# Patient Record
Sex: Female | Born: 1985 | Hispanic: Yes | Marital: Single | Smoking: Never smoker
Health system: Southern US, Community
[De-identification: ages and names within clinical notes are randomized; demographics above are authoritative.]

## PROBLEM LIST (undated history)

## (undated) DIAGNOSIS — J45909 Unspecified asthma, uncomplicated: Secondary | ICD-10-CM

## (undated) DIAGNOSIS — F419 Anxiety disorder, unspecified: Secondary | ICD-10-CM

## (undated) DIAGNOSIS — R87619 Unspecified abnormal cytological findings in specimens from cervix uteri: Secondary | ICD-10-CM

## (undated) DIAGNOSIS — B373 Candidiasis of vulva and vagina: Secondary | ICD-10-CM

## (undated) DIAGNOSIS — E559 Vitamin D deficiency, unspecified: Secondary | ICD-10-CM

## (undated) DIAGNOSIS — R002 Palpitations: Secondary | ICD-10-CM

## (undated) DIAGNOSIS — R0789 Other chest pain: Secondary | ICD-10-CM

## (undated) DIAGNOSIS — J309 Allergic rhinitis, unspecified: Secondary | ICD-10-CM

## (undated) DIAGNOSIS — Z6825 Body mass index (BMI) 25.0-25.9, adult: Secondary | ICD-10-CM

## (undated) DIAGNOSIS — K589 Irritable bowel syndrome without diarrhea: Secondary | ICD-10-CM

## (undated) DIAGNOSIS — Z30018 Encounter for initial prescription of other contraceptives: Secondary | ICD-10-CM

## (undated) DIAGNOSIS — B3731 Acute candidiasis of vulva and vagina: Secondary | ICD-10-CM

## (undated) DIAGNOSIS — G5600 Carpal tunnel syndrome, unspecified upper limb: Secondary | ICD-10-CM

## (undated) DIAGNOSIS — Z6824 Body mass index (BMI) 24.0-24.9, adult: Secondary | ICD-10-CM

## (undated) DIAGNOSIS — B009 Herpesviral infection, unspecified: Secondary | ICD-10-CM

## (undated) DIAGNOSIS — T7840XA Allergy, unspecified, initial encounter: Secondary | ICD-10-CM

## (undated) DIAGNOSIS — G43909 Migraine, unspecified, not intractable, without status migrainosus: Secondary | ICD-10-CM

## (undated) HISTORY — DX: Irritable bowel syndrome, unspecified: K58.9

## (undated) HISTORY — DX: Vitamin D deficiency, unspecified: E55.9

## (undated) HISTORY — DX: Body mass index (BMI) 24.0-24.9, adult: Z68.24

## (undated) HISTORY — DX: Unspecified asthma, uncomplicated: J45.909

## (undated) HISTORY — DX: Carpal tunnel syndrome, unspecified upper limb: G56.00

## (undated) HISTORY — DX: Body mass index (BMI) 25.0-25.9, adult: Z68.25

## (undated) HISTORY — DX: Allergic rhinitis, unspecified: J30.9

## (undated) HISTORY — DX: Allergy, unspecified, initial encounter: T78.40XA

## (undated) HISTORY — DX: Palpitations: R00.2

## (undated) HISTORY — DX: Acute candidiasis of vulva and vagina: B37.31

## (undated) HISTORY — DX: Encounter for initial prescription of other contraceptives: Z30.018

## (undated) HISTORY — DX: Migraine, unspecified, not intractable, without status migrainosus: G43.909

## (undated) HISTORY — PX: INTRAUTERINE DEVICE INSERTION: SHX323

## (undated) HISTORY — DX: Unspecified abnormal cytological findings in specimens from cervix uteri: R87.619

## (undated) HISTORY — DX: Other chest pain: R07.89

## (undated) HISTORY — DX: Anxiety disorder, unspecified: F41.9

## (undated) HISTORY — DX: Candidiasis of vulva and vagina: B37.3

## (undated) HISTORY — DX: Herpesviral infection, unspecified: B00.9

---
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
## 2012-01-15 DIAGNOSIS — B009 Herpesviral infection, unspecified: Secondary | ICD-10-CM

## 2012-01-15 HISTORY — DX: Herpesviral infection, unspecified: B00.9

## 2012-07-14 HISTORY — PX: PLACEMENT OF BREAST IMPLANTS: SHX6334

## 2013-09-21 ENCOUNTER — Encounter: Payer: Self-pay | Admitting: Obstetrics and Gynecology

## 2013-10-06 ENCOUNTER — Encounter: Payer: Self-pay | Admitting: Gynecology

## 2013-10-18 ENCOUNTER — Encounter: Payer: Self-pay | Admitting: Gynecology

## 2013-12-22 ENCOUNTER — Encounter: Payer: Self-pay | Admitting: Gynecology

## 2014-01-04 ENCOUNTER — Encounter: Payer: Self-pay | Admitting: Nurse Practitioner

## 2014-01-04 ENCOUNTER — Ambulatory Visit (INDEPENDENT_AMBULATORY_CARE_PROVIDER_SITE_OTHER): Payer: BC Managed Care – PPO | Admitting: Nurse Practitioner

## 2014-01-04 VITALS — BP 102/64 | HR 72 | Ht 64.25 in | Wt 132.0 lb

## 2014-01-04 DIAGNOSIS — Z0000 Encounter for general adult medical examination without abnormal findings: Secondary | ICD-10-CM

## 2014-01-04 DIAGNOSIS — A499 Bacterial infection, unspecified: Secondary | ICD-10-CM

## 2014-01-04 DIAGNOSIS — B9689 Other specified bacterial agents as the cause of diseases classified elsewhere: Secondary | ICD-10-CM

## 2014-01-04 DIAGNOSIS — Z3049 Encounter for surveillance of other contraceptives: Secondary | ICD-10-CM

## 2014-01-04 DIAGNOSIS — Z01419 Encounter for gynecological examination (general) (routine) without abnormal findings: Secondary | ICD-10-CM

## 2014-01-04 DIAGNOSIS — N760 Acute vaginitis: Secondary | ICD-10-CM

## 2014-01-04 MED ORDER — VALACYCLOVIR HCL 1 G PO TABS: 1000.0000 mg | ORAL_TABLET | Freq: Every day | ORAL | Status: DC

## 2014-01-04 NOTE — Patient Instructions (Addendum)
General topics  Next pap or exam is  due in 1 year Take a Women's multivitamin Take 1200 mg. of calcium daily - prefer dietary If any concerns in interim to call back  Breast Self-Awareness Practicing breast self-awareness may pick up problems early, prevent significant medical complications, and possibly save your life. By practicing breast self-awareness, you can become familiar with how your breasts look and feel and if your breasts are changing. This allows you to notice changes early. It can also offer you some reassurance that your breast health is good. One way to learn what is normal for your breasts and whether your breasts are changing is to do a breast self-exam. If you find a lump or something that was not present in the past, it is best to contact your caregiver right away. Other findings that should be evaluated by your caregiver include nipple discharge, especially if it is bloody; skin changes or reddening; areas where the skin seems to be pulled in (retracted); or new lumps and bumps. Breast pain is seldom associated with cancer (malignancy), but should also be evaluated by a caregiver. BREAST SELF-EXAM The best time to examine your breasts is 5 7 days after your menstrual period is over.  ExitCare Patient Information 2013 [employer]Care[employer].   Exercise to Stay Healthy Exercise helps you become and stay healthy. EXERCISE IDEAS AND TIPS Choose exercises that:  You enjoy.  Fit into your day. You do not need to exercise really hard to be healthy. You can do exercises at a slow or medium level and stay healthy. You can:  Stretch before and after working out.  Try yoga, Pilates, or tai chi.  Lift weights.  Walk fast, swim, jog, run, climb stairs, bicycle, dance, or rollerskate.  Take aerobic classes. Exercises that burn about 150 calories:  Running 1  miles in 15 minutes.  Playing volleyball for 45 to 60 minutes.  Washing and waxing a car for 45 to 60  minutes.  Playing touch football for 45 minutes.  Walking 1  miles in 35 minutes.  Pushing a stroller 1  miles in 30 minutes.  Playing basketball for 30 minutes.  Raking leaves for 30 minutes.  Bicycling 5 miles in 30 minutes.  Walking 2 miles in 30 minutes.  Dancing for 30 minutes.  Shoveling snow for 15 minutes.  Swimming laps for 20 minutes.  Walking up stairs for 15 minutes.  Bicycling 4 miles in 15 minutes.  Gardening for 30 to 45 minutes.  Jumping rope for 15 minutes.  Washing windows or floors for 45 to 60 minutes. Document Released: 02/02/2010 Document Revised: 03/25/2011 Document Reviewed: 02/02/2010 ExitCare Patient Information 2013 ExitCare[employer].   Other topics ( that may be useful information):    Sexually Transmitted Disease Sexually transmitted disease (STD) refers to any infection that is passed from person to person during sexual activity. This may happen by way of saliva, semen, blood, vaginal mucus, or urine. Common STDs include:  Gonorrhea.  Chlamydia.  Syphilis.  HIV/AIDS.  Genital herpes.  Hepatitis B and C.  Trichomonas.  Human papillomavirus (HPV).  Pubic lice. CAUSES  An STD may be spread by bacteria, virus, or parasite. A person can get an STD by:  Sexual intercourse with an infected person.  Sharing sex toys with an infected person.  Sharing needles with an infected person.  Having intimate contact with the genitals, mouth, or rectal areas of an infected person. SYMPTOMS  Some people may not have any symptoms, but   they can still pass the infection to others. Different STDs have different symptoms. Symptoms include:  Painful or bloody urination.  Pain in the pelvis, abdomen, vagina, anus, throat, or eyes.  Skin rash, itching, irritation, growths, or sores (lesions). These usually occur in the genital or anal area.  Abnormal vaginal discharge.  Penile discharge in men.  Soft, flesh-colored skin growths in the  genital or anal area.  Fever.  Pain or bleeding during sexual intercourse.  Swollen glands in the groin area.  Yellow skin and eyes (jaundice). This is seen with hepatitis. DIAGNOSIS  To make a diagnosis, your caregiver may:  Take a medical history.  Perform a physical exam.  Take a specimen (culture) to be examined.  Examine a sample of discharge under a microscope.  Perform blood test TREATMENT   Chlamydia, gonorrhea, trichomonas, and syphilis can be cured with antibiotic medicine.  Genital herpes, hepatitis, and HIV can be treated, but not cured, with prescribed medicines. The medicines will lessen the symptoms.  Genital warts from HPV can be treated with medicine or by freezing, burning (electrocautery), or surgery. Warts may come back.  HPV is a virus and cannot be cured with medicine or surgery.However, abnormal areas may be followed very closely by your caregiver and may be removed from the cervix, vagina, or vulva through office procedures or surgery. If your diagnosis is confirmed, your recent sexual partners need treatment. This is true even if they are symptom-free or have a negative culture or evaluation. They should not have sex until their caregiver says it is okay. HOME CARE INSTRUCTIONS  All sexual partners should be informed, tested, and treated for all STDs.  Take your antibiotics as directed. Finish them even if you start to feel better.  Only take over-the-counter or prescription medicines for pain, discomfort, or fever as directed by your caregiver.  Rest.  Eat a balanced diet and drink enough fluids to keep your urine clear or pale yellow.  Do not have sex until treatment is completed and you have followed up with your caregiver. STDs should be checked after treatment.  Keep all follow-up appointments, Pap tests, and blood tests as directed by your caregiver.  Only use latex condoms and water-soluble lubricants during sexual activity. Do not use  petroleum jelly or oils.  Avoid alcohol and illegal drugs.  Get vaccinated for HPV and hepatitis. If you have not received these vaccines in the past, talk to your caregiver about whether one or both might be right for you.  Avoid risky sex practices that can break the skin. The only way to avoid getting an STD is to avoid all sexual activity.Latex condoms and dental dams (for oral sex) will help lessen the risk of getting an STD, but will not completely eliminate the risk. SEEK MEDICAL CARE IF:   You have a fever.  You have any new or worsening symptoms. Document Released: 03/23/2002 Document Revised: 03/25/2011 Document Reviewed: 03/30/2010 [facility] Patient Information 2013 [place].    Domestic Abuse You are being battered or abused if someone close to you hits, pushes, or physically hurts you in any way. You also are being abused if you are forced into activities. You are being sexually abused if you are forced to have sexual contact of any kind. You are being emotionally abused if you are made to feel worthless or if you are constantly threatened. It is important to remember that help is available. No one has the right to abuse you. PREVENTION OF FURTHER  ABUSE  Learn the warning signs of danger. This varies with situations but may include: the use of alcohol, threats, isolation from friends and family, or forced sexual contact. Leave if you feel that violence is going to occur.  If you are attacked or beaten, report it to the police so the abuse is documented. You do not have to press charges. The police can protect you while you or the attackers are leaving. Get the officer's name and badge number and a copy of the report.  Find someone you can trust and tell them what is happening to you: your caregiver, a nurse, clergy member, close friend or family member. Feeling ashamed is natural, but remember that you have done nothing wrong. No one deserves abuse. Document Released:  12/29/1999 Document Revised: 03/25/2011 Document Reviewed: 03/08/2010 ExitCare Patient Information 2013 ExitCare[employer] LLC.    How Much is Too Much Alcohol? Drinking too much alcohol can cause injury, accidents, and health problems. These types of problems can include:   Car crashes.  Falls.  Family fighting (domestic violence).  Drowning.  Fights.  Injuries.  Burns.  Damage to certain organs.  Having a baby with birth defects. ONE DRINK CAN BE TOO MUCH WHEN YOU ARE:  Working.  Pregnant or breastfeeding.  Taking medicines. Ask your doctor.  Driving or planning to drive. If you or someone you know has a drinking problem, get help from a doctor.  Document Released: 10/27/2008 Document Revised: 03/25/2011 Document Reviewed: 10/27/2008 ExitCare Patient Information 2013 ExitCare[employer] LLC.   Smoking Hazards Smoking cigarettes is extremely bad for your health. Tobacco smoke has over 200 known poisons in it. There are over 60 chemicals in tobacco smoke that cause cancer. Some of the chemicals found in cigarette smoke include:   Cyanide.  Benzene.  Formaldehyde.  Methanol (wood alcohol).  Acetylene (fuel used in welding torches).  Ammonia. Cigarette smoke also contains the poisonous gases nitrogen oxide and carbon monoxide.  Cigarette smokers have an increased risk of many serious medical problems and Smoking causes approximately:  90% of all lung cancer deaths in men.  80% of all lung cancer deaths in women.  90% of deaths from chronic obstructive lung disease. Compared with nonsmokers, smoking increases the risk of:  Coronary heart disease by 2 to 4 times.  Stroke by 2 to 4 times.  Men developing lung cancer by 23 times.  Women developing lung cancer by 13 times.  Dying from chronic obstructive lung diseases by 12 times.  . Smoking is the most preventable cause of death and disease in our society.  WHY IS SMOKING ADDICTIVE?  Nicotine is the chemical  agent in tobacco that is capable of causing addiction or dependence.  When you smoke and inhale, nicotine is absorbed rapidly into the bloodstream through your lungs. Nicotine absorbed through the lungs is capable of creating a powerful addiction. Both inhaled and non-inhaled nicotine may be addictive.  Addiction studies of cigarettes and spit tobacco show that addiction to nicotine occurs mainly during the teen years, when young people begin using tobacco products. WHAT ARE THE BENEFITS OF QUITTING?  There are many health benefits to quitting smoking.   Likelihood of developing cancer and heart disease decreases. Health improvements are seen almost immediately.  Blood pressure, pulse rate, and breathing patterns start returning to normal soon after quitting. QUITTING SMOKING   American Lung Association - 1-800-LUNGUSA  American Cancer Society - 1-800-ACS-2345 Document Released: 02/08/2004 Document Revised: 03/25/2011 Document Reviewed: 10/12/2008 ExitCare Patient Information 2013 ExitCare,   LLC.   Stress Management Stress is a state of physical or mental tension that often results from changes in your life or normal routine. Some common causes of stress are:  Death of a loved one.  Injuries or severe illnesses.  Getting fired or changing jobs.  Moving into a new home. Other causes may be:  Sexual problems.  Business or financial losses.  Taking on a large debt.  Regular conflict with someone at home or at work.  Constant tiredness from lack of sleep. It is not just bad things that are stressful. It may be stressful to:  Win the lottery.  Get married.  Buy a new car. The amount of stress that can be easily tolerated varies from person to person. Changes generally cause stress, regardless of the types of change. Too much stress can affect your health. It may lead to physical or emotional problems. Too little stress (boredom) may also become stressful. SUGGESTIONS TO  REDUCE STRESS:  Talk things over with your family and friends. It often is helpful to share your concerns and worries. If you feel your problem is serious, you may want to get help from a professional counselor.  Consider your problems one at a time instead of lumping them all together. Trying to take care of everything at once may seem impossible. List all the things you need to do and then start with the most important one. Set a goal to accomplish 2 or 3 things each day. If you expect to do too many in a single day you will naturally fail, causing you to feel even more stressed.  Do not use alcohol or drugs to relieve stress. Although you may feel better for a short time, they do not remove the problems that caused the stress. They can also be habit forming.  Exercise regularly - at least 3 times per week. Physical exercise can help to relieve that "uptight" feeling and will relax you.  The shortest distance between despair and hope is often a good night's sleep.  Go to bed and get up on time allowing yourself time for appointments without being rushed.  Take a short "time-out" period from any stressful situation that occurs during the day. Close your eyes and take some deep breaths. Starting with the muscles in your face, tense them, hold it for a few seconds, then relax. Repeat this with the muscles in your neck, shoulders, hand, stomach, back and legs.  Take good care of yourself. Eat a balanced diet and get plenty of rest.  Schedule time for having fun. Take a break from your daily routine to relax. HOME CARE INSTRUCTIONS   Call if you feel overwhelmed by your problems and feel you can no longer manage them on your own.  Return immediately if you feel like hurting yourself or someone else. Document Released: 06/26/2000 Document Revised: 03/25/2011 Document Reviewed: 02/16/2007 [facility] Patient Information 2013 [place].   Pro B vaginal probiotic take daily   Bacterial  Vaginosis Bacterial vaginosis is a vaginal infection that occurs when the normal balance of bacteria in the vagina is disrupted. It results from an overgrowth of certain bacteria. This is the most common vaginal infection in women of childbearing age. Treatment is important to prevent complications, especially in pregnant women, as it can cause a premature delivery. CAUSES  Bacterial vaginosis is caused by an increase in harmful bacteria that are normally present in smaller amounts in the vagina. Several different kinds of bacteria can cause bacterial  vaginosis. However, the reason that the condition develops is not fully understood. RISK FACTORS Certain activities or behaviors can put you at an increased risk of developing bacterial vaginosis, including:  Having a new sex partner or multiple sex partners.  Douching.  Using an intrauterine device (IUD) for contraception. Women do not get bacterial vaginosis from toilet seats, bedding, swimming pools, or contact with objects around them. SIGNS AND SYMPTOMS  Some women with bacterial vaginosis have no signs or symptoms. Common symptoms include:  Grey vaginal discharge.  A fishlike odor with discharge, especially after sexual intercourse.  Itching or burning of the vagina and vulva.  Burning or pain with urination. DIAGNOSIS  Your health care provider will take a medical history and examine the vagina for signs of bacterial vaginosis. A sample of vaginal fluid may be taken. Your health care provider will look at this sample under a microscope to check for bacteria and abnormal cells. A vaginal pH test may also be done.  TREATMENT  Bacterial vaginosis may be treated with antibiotic medicines. These may be given in the form of a pill or a vaginal cream. A second round of antibiotics may be prescribed if the condition comes back after treatment.  HOME CARE INSTRUCTIONS   Only take over-the-counter or prescription medicines as directed by your  health care provider.  If antibiotic medicine was prescribed, take it as directed. Make sure you finish it even if you start to feel better.  Do not have sex until treatment is completed.  Tell all sexual partners that you have a vaginal infection. They should see their health care provider and be treated if they have problems, such as a mild rash or itching.  Practice safe sex by using condoms and only having one sex partner. SEEK MEDICAL CARE IF:   Your symptoms are not improving after 3 days of treatment.  You have increased discharge or pain.  You have a fever. MAKE SURE YOU:   Understand these instructions.  Will watch your condition.  Will get help right away if you are not doing well or get worse. FOR MORE INFORMATION  Centers for Disease Control and Prevention, Division of STD Prevention: AppraiserFraud.fi American Sexual Health Association (ASHA): www.ashastd.org  Document Released: 12/31/2004 Document Revised: 10/21/2012 Document Reviewed: 08/12/2012 [facility] Patient Information 2015 [place], [place]. This information is not intended to replace advice given to you by your health care provider. Make sure you discuss any questions you have with your health care provider.

## 2014-01-04 NOTE — Progress Notes (Signed)
28 y.o. G0 Single Hispanic Fe here for NGYN annual exam.  Previous GYN either moved or quit practice.  Menses is usually lasting 4 -7 days.  Flow is usually heavy for 2 days. Changing tampon/ pad every 4 hours.  Cramps are every 2-3 months for 1 week prior to cycle and during cycle. Help with OTC NSAID's.  Using condom at times. Desires birth control.  She has been on OCP in past but not very compliant.  She desires other options.  Same partner for about 10 years.  They have not made a final decion about family but leaning toward no children. Other concerns is  problems with chronic BV from time to time.  Patient's last menstrual period was 12/07/2013.         Sexually active: Yes.    The current method of family planning is condoms sometimes.    Exercising: Yes.    Home exercise routine includes running twice a week. Smoker:  no  Health Maintenance: Pap:  08/19/12 neg, no HPV TDaP: maybe within the last 2 years - will check. Labs: PCP                 UA: PCP   reports that she has never smoked. She has never used smokeless tobacco. She reports that she drinks alcohol. She reports that she does not use illicit drugs.  Past Medical History  Diagnosis Date  . HSV-2 (herpes simplex virus 2) infection 2014    Past Surgical History  Procedure Laterality Date  . Cholecystectomy  2005    Current Outpatient Prescriptions  Medication Sig Dispense Refill  . Doxylamine-DM (VICKS NYQUIL COUGH PO) Take by mouth as needed.    . valACYclovir (VALTREX) 1000 MG tablet Take 1 tablet (1,000 mg total) by mouth daily. 90 tablet 3   No current facility-administered medications for this visit.    Family History  Problem Relation Age of Onset  . Osteoarthritis Mother   . Osteoarthritis Maternal Grandfather     ROS:  Pertinent items are noted in HPI.  Otherwise, a comprehensive ROS was negative.  Exam:   BP 102/64 mmHg  Pulse 72  Ht 5' 4.25" (1.632 m)  Wt 132 lb (59.875 kg)  BMI 22.48 kg/m2  LMP  12/07/2013 Height: 5' 4.25" (163.2 cm)  Ht Readings from Last 3 Encounters:  01/04/14 5' 4.25" (1.632 m)    General appearance: alert, cooperative and appears stated age Head: Normocephalic, without obvious abnormality, atraumatic Neck: no adenopathy, supple, symmetrical, trachea midline and thyroid normal to inspection and palpation Lungs: clear to auscultation bilaterally Breasts: normal appearance, no masses or tenderness Heart: regular rate and rhythm Abdomen: soft, non-tender; no masses,  no organomegaly Extremities: extremities normal, atraumatic, no cyanosis or edema Skin: Skin color, texture, turgor normal. No rashes or lesions Lymph nodes: Cervical, supraclavicular, and axillary nodes normal. No abnormal inguinal nodes palpated Neurologic: Grossly normal   Pelvic: External genitalia:  no lesions              Urethra:  normal appearing urethra with no masses, tenderness or lesions              Bartholin's and Skene's: normal                 Vagina: normal appearing vagina with normal color and discharge, no lesions              Cervix: anteverted  Pap taken: Yes.   Affirm testing is done Bimanual Exam:  Uterus:  normal size, contour, position, consistency, mobility, non-tender              Adnexa: no mass, fullness, tenderness               Rectovaginal: Confirms               Anus:  normal sphincter tone, no lesions  A:  Well Woman with normal exam  Condoms for BC  Desires Skyla IUD  R/O BV - history of acute and chronic   P:   Reviewed health and wellness pertinent to exam  Pap smear taken today  Discussion about various methods of birth control including OCP, Nuva Ring, Depo Provera, and IUD.    [facility] - [facility] is placed.  She is aware will need MD consult prior to insertion and that placement is done on menses.  Will hopefully get done in January.   Affirm testing is done and will follow. Advised not to  wear thongs.  Will also get Pro B OTC and take daily  Counseled on breast self exam, use and side effects of OCP's, family planning choices, adequate intake of calcium and vitamin D, diet and exercise return annually or prn  An After Visit Summary was printed and given to the patient.

## 2014-01-05 ENCOUNTER — Telehealth: Payer: Self-pay | Admitting: *Deleted

## 2014-01-05 ENCOUNTER — Other Ambulatory Visit: Payer: Self-pay | Admitting: Certified Nurse Midwife

## 2014-01-05 DIAGNOSIS — B3731 Acute candidiasis of vulva and vagina: Secondary | ICD-10-CM

## 2014-01-05 DIAGNOSIS — B373 Candidiasis of vulva and vagina: Secondary | ICD-10-CM

## 2014-01-05 LAB — WET PREP BY MOLECULAR PROBE
CANDIDA SPECIES: POSITIVE — AB
Gardnerella vaginalis: NEGATIVE
Trichomonas vaginosis: NEGATIVE

## 2014-01-05 MED ORDER — FLUCONAZOLE 150 MG PO TABS: 150.0000 mg | ORAL_TABLET | Freq: Once | ORAL | Status: DC

## 2014-01-05 NOTE — Telephone Encounter (Signed)
-----   Message from Verner Choleborah S Leonard, CNM sent at 01/05/2014 12:35 PM EST ----- Notify patient that affirm is positive for yeast  Rx Terazol 7 vaginal cream sent to pharmacy

## 2014-01-05 NOTE — Telephone Encounter (Signed)
I have attempted to contact this patient by phone with the following results: left message to return call to [place] at [phone] on answering machine (mobile [facility] Advised call regarding labs. [phone] (Mobile)

## 2014-01-10 LAB — IPS PAP TEST WITH REFLEX TO HPV

## 2014-01-10 MED ORDER — ACYCLOVIR 400 MG PO TABS: 400.0000 mg | ORAL_TABLET | Freq: Two times a day (BID) | ORAL | Status: DC

## 2014-01-10 NOTE — Telephone Encounter (Signed)
She could try acyclovir 400 mg BID as maintenance dose and see if less expensive.  Can have refills until AEX.

## 2014-01-10 NOTE — Telephone Encounter (Signed)
Spoke with patient. Advised patient of message as seen below from Lauro FranklinPatricia Rolen-Grubb, FNP. Patient is agreeable. Rx for Acylcovir 400 mg BID #[id number] sent to pharmacy on file.  Routing to provider for final review. Patient agreeable to disposition. Will close encounter

## 2014-01-10 NOTE — Telephone Encounter (Signed)
Pt says the medication that was prescribed to her is too expensive and wondering if she can try a lower dosage.

## 2014-01-10 NOTE — Telephone Encounter (Signed)
Patient was seen on 12/22 with Ashley FranklinPatricia Rolen-Grubb, FNP. Affirm testing showed yeast. Was given Terazol 7. Patient states rx is too expensive and is requesting alternative. Please advise.

## 2014-01-10 NOTE — Telephone Encounter (Signed)
Spoke with patient. Advised patient of message as seen below from Lauro FranklinPatricia Rolen-Grubb, FNP. Patient is agreeable. Patient also states that rx for Valtrex is too expensive. "It used to only be like 10 dollars a month and now it is 8336 with what she called in. I am taking it 500 mg daily." Advised patient will send a message over to Lauro FranklinPatricia Rolen-Grubb, FNP and return call with further recommendations and instructions. Patient is agreeable.

## 2014-01-10 NOTE — Progress Notes (Signed)
Encounter reviewed by Dr. Brook Silva.  

## 2014-01-10 NOTE — Telephone Encounter (Signed)
Lets try Diflucan 150 mg X 2 doses as directed.

## 2014-01-11 ENCOUNTER — Telehealth: Payer: Self-pay | Admitting: Nurse Practitioner

## 2014-01-11 NOTE — Telephone Encounter (Signed)
Left message for patient to call back. Need to go over iud benefits. °Pr $0 °

## 2014-01-28 NOTE — Telephone Encounter (Signed)
Spoke with patient. Advised that per benefits quote received, IUD and insertion is covered at 100%. There will be 0 patient liability. Patient is to call within the first 5 days of her cycle to schedule insertion. °

## 2014-02-14 ENCOUNTER — Other Ambulatory Visit: Payer: Self-pay | Admitting: Nurse Practitioner

## 2014-02-14 MED ORDER — MISOPROSTOL 200 MCG PO TABS: ORAL_TABLET | ORAL | Status: DC

## 2014-02-14 NOTE — Telephone Encounter (Signed)
Patient calling to report she started her menstrual cycle yesterday and needs to schedule her IUD placement.

## 2014-02-14 NOTE — Telephone Encounter (Signed)
Annual exam with patty on 01-04-14 and discussed IUD. Patient calling to schedule insertion. Desires Skyla Menses started 02-13-14. G0, same partner for 10 years.  Advised patient usually would need consult to discuss with MD prior to insertion. Can schedule appointment for consult and will be up to provider if can proceed with insertion at that time. Discussed that in preparation for procedure, will need to take Motrin 800 mg one hour prior with food. MD will review chart. May also want her to take Cytotec 200 mg insert evening before and morning of procedure. Anything else needed?

## 2014-02-14 NOTE — Telephone Encounter (Signed)
Agree with plan.  Cytotec order signed.

## 2014-02-15 NOTE — Telephone Encounter (Signed)
Patient advised Dr. Hyacinth MeekerMiller agrees with plan. Rx for cytotec was sent by Dr. Hyacinth MeekerMiller and patient should pick up today for use tomorrow night and Thursday am. Instructions given. Appointment confirmed.  Patient agreeable. Encounter is closed.

## 2014-02-17 ENCOUNTER — Ambulatory Visit (INDEPENDENT_AMBULATORY_CARE_PROVIDER_SITE_OTHER): Payer: BC Managed Care – PPO | Admitting: Obstetrics & Gynecology

## 2014-02-17 VITALS — BP 102/72 | HR 64 | Resp 16 | Wt 135.6 lb

## 2014-02-17 DIAGNOSIS — Z30430 Encounter for insertion of intrauterine contraceptive device: Secondary | ICD-10-CM

## 2014-02-17 DIAGNOSIS — Z202 Contact with and (suspected) exposure to infections with a predominantly sexual mode of transmission: Secondary | ICD-10-CM

## 2014-02-17 DIAGNOSIS — N938 Other specified abnormal uterine and vaginal bleeding: Secondary | ICD-10-CM

## 2014-02-17 LAB — POCT URINE PREGNANCY: Preg Test, Ur: NEGATIVE

## 2014-02-17 NOTE — Progress Notes (Signed)
Subjective:     Patient ID: Ashley Dixon, female   DOB: [birth year], 29 y.o.   MRN: [id number]  HPI 29 yo G0 Single Hispanic Female her for consultation regarding possible IUD placement vs other contraception options.  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  Pt is in a long term relationship.  They have been together for 10 years.  Reports this cycle was short for her but this seems to happen once or twice a year.  Not faithful with condom use.  UPT neg here today.  IUD's (different ones), placement, risks, and benefits discussed including but not limited to uterine perforation, imbedded IUD, ectopic pregnancy, irregular bleeding, cramping/pain.  With Paragard IUD, risks of increased bleeding/pain/length of cycles discussed. Other BC methods discussed.  Pt most interested in IUD.  Due to LMP 02/13/14 IUD placement is appropriate today.  Pt would like to proceed with IUD placement today and she would prefer three year IUD if appropriate.  Will determine with procedure.  All questions answered.  Pt aware I will also obtained GC/Chl testing today.  Voiced understanding.   Review of Systems  All other systems reviewed and are negative.      Objective:   Physical Exam  Constitutional: She is oriented to person, place, and time. She appears well-developed and well-nourished.  Abdominal: Soft. Bowel sounds are normal.  Genitourinary: Vagina normal and uterus normal.  Neurological: She is alert and oriented to person, place, and time.  Skin: Skin is warm and dry.  Psychiatric: She has a normal mood and affect.   Informed consent was obtained.    Procedure:  Speculum inserted into vagina. Cervix visualized and cleansed with betadine solution X 3. Paracervical block was not placed.  Tenaculum placed on cervix at 12 o'clock position.  Uterus sounded to 7 centimeters.  IUD removed from sterile packet and under sterile conditions inserted to fundus of uterus.  Introducer removed without  difficulty.  IUD string trimmed to 2 centimeters.  Remainder string given to patient to feel for identification.  Tenaculum removed.  No bleeding noted.  Speculum removed.  Uterus palpated normal.  Patient tolerated procedure well.  IUD Lot #:[id number].  Exp: 2/17.  Package information attached to consent and scanned into EPIC   Assessment:     A: Insertion of Skyla     Plan:     P: Return to office 6 weeks for recheck Pt knows IUD needs to be replaced approximately 02/17/2017.  Instructions provided. GC/Chl pending     Before procedure, about 15 minutes spent in face to face discussion with pt regarding options.

## 2014-02-17 NOTE — Addendum Note (Signed)
Addended by: Jerene BearsMILLER, Kamarion Zagami S on: 02/17/2014 05:20 PM   Modules accepted: Orders

## 2014-02-18 LAB — GC/CHLAMYDIA PROBE AMP, URINE
Chlamydia, Swab/Urine, PCR: NEGATIVE
GC Probe Amp, Urine: NEGATIVE

## 2014-02-21 ENCOUNTER — Telehealth: Payer: Self-pay

## 2014-02-21 NOTE — Telephone Encounter (Signed)
-----   Message from Annamaria BootsMary Suzanne Miller, MD sent at 02/18/2014  5:53 PM EST ----- Inform GC/Chl neg please

## 2014-02-21 NOTE — Telephone Encounter (Signed)
Returning a call to Kelly °

## 2014-02-21 NOTE — Telephone Encounter (Signed)
2/8 lmtcb//kn

## 2014-02-22 NOTE — Telephone Encounter (Signed)
Pt notified in result note.  Closing encounter. 

## 2014-04-01 ENCOUNTER — Encounter: Payer: Self-pay | Admitting: Obstetrics & Gynecology

## 2014-04-01 ENCOUNTER — Ambulatory Visit (INDEPENDENT_AMBULATORY_CARE_PROVIDER_SITE_OTHER): Payer: BC Managed Care – PPO | Admitting: Obstetrics & Gynecology

## 2014-04-01 VITALS — BP 120/60 | HR 86 | Ht 64.25 in | Wt 132.6 lb

## 2014-04-01 DIAGNOSIS — N938 Other specified abnormal uterine and vaginal bleeding: Secondary | ICD-10-CM | POA: Diagnosis not present

## 2014-04-01 DIAGNOSIS — Z30431 Encounter for routine checking of intrauterine contraceptive device: Secondary | ICD-10-CM

## 2014-04-01 NOTE — Progress Notes (Signed)
Subjective:     Patient ID: Ashley Dixon, female   DOB: 1985-02-01, 29 y.o.   MRN: [id number]  HPI 29 yo G0 S Hispanic [facility].  Pt reports she has been spotting since her IUD was placed.  LMP 2/29.  Reports this was heavy (heavier than normal) for three days.  Cramping was mild.  She did have significant cramping for three days after IUD placement but this did resolve on its own.  Denies vaginal odor.  No abdominal or pelvic pain.  Review of Systems  Genitourinary: Positive for vaginal bleeding (spotting).  All other systems reviewed and are negative.      Objective:   Physical Exam  Constitutional: She is oriented to person, place, and time. She appears well-developed and well-nourished.  Abdominal: Soft. Bowel sounds are normal.  Genitourinary: Vagina normal and uterus normal. There is no rash, tenderness or lesion on the right labia. There is no rash, tenderness or lesion on the left labia. Cervix exhibits no motion tenderness and no discharge.  Scant blood/mucous noted at cervical os.  2cm IUD string noted.  No CMT.  Lymphadenopathy:       Right: No inguinal adenopathy present.       Left: No inguinal adenopathy present.  Neurological: She is alert and oriented to person, place, and time.  Psychiatric: She has a normal mood and affect.       Assessment:     DUB after IUD placement  Normal exam    Plan:     D/w pt typical bleeding course after IUD placement.  As pt is having a little more spotting that is typical, in my experience, exam is normal.  Plan to recheck 6-8 weeks.  If spotting continues, will plan PUS for additional assessment at that time. All discussed with pt and she is in agreement with plan.    ~15 minutes spent with patient >50% of time was in face to face discussion of above.

## 2014-05-27 ENCOUNTER — Telehealth: Payer: Self-pay | Admitting: Obstetrics & Gynecology

## 2014-05-27 ENCOUNTER — Ambulatory Visit: Payer: BC Managed Care – PPO | Admitting: Obstetrics & Gynecology

## 2014-05-27 NOTE — Telephone Encounter (Signed)
Pt called to cancel 230 appt this afternoon for 6-8 week recheck with Dr Hyacinth MeekerMiller. 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  Pt states she will call back to reschedule.

## 2014-05-31 NOTE — Telephone Encounter (Signed)
Dr Miller, just FYI.//kn 

## 2014-05-31 NOTE — Telephone Encounter (Signed)
Complete

## 2014-05-31 NOTE — Telephone Encounter (Signed)
Please don't apply a NS charge for pt.  Thanks.  Ok to close encounter.

## 2014-09-08 ENCOUNTER — Telehealth: Payer: Self-pay | Admitting: Nurse Practitioner

## 2014-09-08 NOTE — Telephone Encounter (Signed)
I called the patient back and left a message to call back to schedule an appointment today, if possible, before she goes out of town.

## 2014-09-08 NOTE — Telephone Encounter (Signed)
Patient called and left a message on the answering machine at lunch. She said, "I am going out of town later today and feel like I am coming down with a UTI. I am hoping you can call something in."

## 2014-09-09 NOTE — Telephone Encounter (Signed)
Called patient. 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 Now she is out of town in [place]. She states she is having urinary tract infection symptoms.  I have advised patient that it is the policy of [facility] to not provide telephone treatment with antibiotic therapy for patient suspected urinary tract infections. This is due to the  Importance of urine testing to ensure appropriate antibiotic treatment, if necessary, and to rule out any possibility of pelvic infection or further problems. Advised patient that I recommend that she be evaluated by a physician at a local urgent care and to not to delay her care as potential infections can worsen with time. Patient states "okay" and disconnected line.  Routing to provider for final review.

## 2014-09-09 NOTE — Telephone Encounter (Signed)
Thank you for the information.  OK to close encounter.

## 2015-01-06 ENCOUNTER — Ambulatory Visit (INDEPENDENT_AMBULATORY_CARE_PROVIDER_SITE_OTHER): Payer: BC Managed Care – PPO | Admitting: Nurse Practitioner

## 2015-01-06 ENCOUNTER — Encounter: Payer: Self-pay | Admitting: Nurse Practitioner

## 2015-01-06 VITALS — BP 100/66 | HR 68 | Ht 63.5 in | Wt 119.0 lb

## 2015-01-06 DIAGNOSIS — N938 Other specified abnormal uterine and vaginal bleeding: Secondary | ICD-10-CM

## 2015-01-06 DIAGNOSIS — Z30431 Encounter for routine checking of intrauterine contraceptive device: Secondary | ICD-10-CM | POA: Diagnosis not present

## 2015-01-06 DIAGNOSIS — Z23 Encounter for immunization: Secondary | ICD-10-CM

## 2015-01-06 DIAGNOSIS — Z113 Encounter for screening for infections with a predominantly sexual mode of transmission: Secondary | ICD-10-CM | POA: Diagnosis not present

## 2015-01-06 DIAGNOSIS — Z01419 Encounter for gynecological examination (general) (routine) without abnormal findings: Secondary | ICD-10-CM

## 2015-01-06 DIAGNOSIS — Z0000 Encounter for general adult medical examination without abnormal findings: Secondary | ICD-10-CM | POA: Diagnosis not present

## 2015-01-06 LAB — STD PANEL
HEP B S AG: NEGATIVE
HIV: NONREACTIVE

## 2015-01-06 LAB — HEMOGLOBIN, FINGERSTICK: Hemoglobin, fingerstick: 12.7 g/dL (ref 12.0–16.0)

## 2015-01-06 MED ORDER — ACYCLOVIR 400 MG PO TABS: 400.0000 mg | ORAL_TABLET | Freq: Two times a day (BID) | ORAL | Status: DC

## 2015-01-06 NOTE — Progress Notes (Signed)
Patient ID: Ashley Dixon, female   DOB: July 04, 1985, 29 y.o.   MRN: [id number] 29 y.o. G0P0 Single  Hispanic Fe here for annual exam.  Since IUD insertion 02/2014 she has had light flow to spotting but now lasting X 2 weeks.  Some cramps with cycles that is bad for a few days. Same partner without change.  No pain with SA.  Patient's last menstrual period was 12/07/2014 (exact date).          Sexually active: Yes.    The current method of family planning is IUD.  Skyla inserted 02/17/14. Exercising: Yes.    cardio 3 times per week Smoker:  no  Health Maintenance: Pap:  01/04/14, Negative TDaP:  will update today Hep C and HIV: Hep C not indicated due to age; HIV will do today Labs: HB: 12.7  Urine: negative   reports that she has never smoked. She has never used smokeless tobacco. She reports that she drinks alcohol. She reports that she does not use illicit drugs.  Past Medical History  Diagnosis Date  . HSV-2 (herpes simplex virus 2) infection 2014    Past Surgical History  Procedure Laterality Date  . Cholecystectomy  2005    Current Outpatient Prescriptions  Medication Sig Dispense Refill  . acyclovir (ZOVIRAX) 400 MG tablet Take 1 tablet (400 mg total) by mouth 2 (two) times daily. 60 tablet 12  . CLOBETASOL PROPIONATE E 0.05 % emollient cream     . fluticasone (FLONASE) 50 MCG/ACT nasal spray Place 2 sprays into both nostrils daily.   1  . montelukast (SINGULAIR) 10 MG tablet Take 10 mg by mouth at bedtime.   1  . mupirocin cream (BACTROBAN) 2 % Apply 1 application topically as directed.     No current facility-administered medications for this visit.    Family History  Problem Relation Age of Onset  . Osteoarthritis Mother   . Osteoarthritis Maternal Grandfather   . Diabetes Father 68    type 2    ROS:  Pertinent items are noted in HPI.  Otherwise, a comprehensive ROS was negative.  Exam:   BP 100/66 mmHg  Pulse 68  Ht 5' 3.5" (1.613 m)  Wt 119 lb (53.978 kg)   BMI 20.75 kg/m2  LMP 12/07/2014 (Exact Date) Height: 5' 3.5" (161.3 cm) Ht Readings from Last 3 Encounters:  01/06/15 5' 3.5" (1.613 m)  04/01/14 5' 4.25" (1.632 m)  01/04/14 5' 4.25" (1.632 m)    General appearance: alert, cooperative and appears stated age Head: Normocephalic, without obvious abnormality, atraumatic Neck: no adenopathy, supple, symmetrical, trachea midline and thyroid normal to inspection and palpation Lungs: clear to auscultation bilaterally Breasts: normal appearance, no masses or tenderness Heart: regular rate and rhythm Abdomen: soft, non-tender; no masses,  no organomegaly Extremities: extremities normal, atraumatic, no cyanosis or edema Skin: Skin color, texture, turgor normal. No rashes or lesions Lymph nodes: Cervical, supraclavicular, and axillary nodes normal. No abnormal inguinal nodes palpated Neurologic: Grossly normal   Pelvic: External genitalia:  no lesions              Urethra:  normal appearing urethra with no masses, tenderness or lesions              Bartholin's and Skene's: normal                 Vagina: normal appearing vagina with normal color and discharge, no lesions  Cervix: anteverted   IUD strings are visible            Pap taken: Yes.   Bimanual Exam:  Uterus:  normal size, contour, position, consistency, mobility, non-tender              Adnexa: no mass, fullness, tenderness               Rectovaginal: Confirms               Anus:  normal sphincter tone, no lesions  Chaperone present: yes  A:  Well Woman with normal  Skyla IUD 2/2016with prolonged menses- will evaluate  R/O STD's  Update immunization   P:   Reviewed health and wellness pertinent to exam  Pap smear as above  Will get PUS per Dr. Hyacinth MeekerMiller to check IUD placement  Update TDaP today  Counseled on breast self exam, STD prevention, HIV risk factors and prevention, adequate intake of calcium and vitamin D, diet and exercise return annually or  prn  An After Visit Summary was printed and given to the patient.

## 2015-01-06 NOTE — Progress Notes (Signed)
Reviewed personally.  M. Suzanne [place] Mcphee, MD.  

## 2015-01-06 NOTE — Progress Notes (Signed)
Patient ID: Ashley Dixon, female   DOB: 03/19/1985, 29 y.o.   MRN: [id number]

## 2015-01-06 NOTE — Patient Instructions (Signed)

## 2015-01-07 LAB — WET PREP BY MOLECULAR PROBE
Candida species: NEGATIVE
GARDNERELLA VAGINALIS: NEGATIVE
Trichomonas vaginosis: NEGATIVE

## 2015-01-10 ENCOUNTER — Telehealth: Payer: Self-pay | Admitting: Obstetrics & Gynecology

## 2015-01-10 NOTE — Telephone Encounter (Signed)
Spoke with patient regarding benefit. Patient understood and agreeable. Patient understood and agreeable to 72 hour cancellation policy with $100 fee. Patient agreeable to arrival date/time. No further questions. Ok to close.

## 2015-01-10 NOTE — Telephone Encounter (Signed)
Called patient to discuss benefits for a procedure. Left Voicemail requesting a call back. °

## 2015-01-11 LAB — IPS N GONORRHOEA AND CHLAMYDIA BY PCR

## 2015-01-11 LAB — IPS PAP TEST WITH REFLEX TO HPV

## 2015-01-12 ENCOUNTER — Ambulatory Visit (INDEPENDENT_AMBULATORY_CARE_PROVIDER_SITE_OTHER): Payer: BC Managed Care – PPO | Admitting: Obstetrics & Gynecology

## 2015-01-12 ENCOUNTER — Ambulatory Visit (INDEPENDENT_AMBULATORY_CARE_PROVIDER_SITE_OTHER): Payer: BC Managed Care – PPO

## 2015-01-12 ENCOUNTER — Encounter: Payer: Self-pay | Admitting: Obstetrics & Gynecology

## 2015-01-12 VITALS — BP 110/60 | HR 68 | Resp 18 | Ht 63.5 in | Wt 119.0 lb

## 2015-01-12 DIAGNOSIS — N938 Other specified abnormal uterine and vaginal bleeding: Secondary | ICD-10-CM | POA: Diagnosis not present

## 2015-01-12 DIAGNOSIS — Z975 Presence of (intrauterine) contraceptive device: Secondary | ICD-10-CM | POA: Diagnosis not present

## 2015-01-12 DIAGNOSIS — Z30431 Encounter for routine checking of intrauterine contraceptive device: Secondary | ICD-10-CM | POA: Diagnosis not present

## 2015-01-12 HISTORY — DX: Presence of (intrauterine) contraceptive device: Z97.5

## 2015-01-12 MED ORDER — NORETHIN ACE-ETH ESTRAD-FE 1-20 MG-MCG PO TABS: 1.0000 | ORAL_TABLET | Freq: Every day | ORAL | Status: DC

## 2015-01-12 NOTE — Patient Instructions (Signed)
Ethinyl Estradiol; Etonogestrel vaginal ring (NUVA Ring) What is this medicine? ETHINYL ESTRADIOL; ETONOGESTREL (ETH in il es tra DYE ole; et oh noe JES trel) vaginal ring is a flexible, vaginal ring used as a contraceptive (birth control method). This medicine combines two types of female hormones, an estrogen and a progestin. This ring is used to prevent ovulation and pregnancy. Each ring is effective for one month. This medicine may be used for other purposes; ask your health care provider or pharmacist if you have questions. What should I tell my health care provider before I take this medicine? They need to know if you have or ever had any of these conditions: -abnormal vaginal bleeding -blood vessel disease or blood clots -breast, cervical, endometrial, ovarian, liver, or uterine cancer -diabetes -gallbladder disease -heart disease or recent heart attack -high blood pressure -high cholesterol -kidney disease -liver disease -migraine headaches -stroke -systemic lupus erythematosus (SLE) -tobacco smoker -an unusual or allergic reaction to estrogens, progestins, other medicines, foods, dyes, or preservatives -pregnant or trying to get pregnant -breast-feeding How should I use this medicine? Insert the ring into your vagina as directed. Follow the directions on the prescription label. The ring will remain place for 3 weeks and is then removed for a 1-week break. A new ring is inserted 1 week after the last ring was removed, on the same day of the week. Do not use more often than directed. A patient package insert for the product will be given with each prescription and refill. Read this sheet carefully each time. The sheet may change frequently. Contact your pediatrician regarding the use of this medicine in children. Special care may be needed. This medicine has been used in female children who have started having menstrual periods. Overdosage: If you think you have taken too much of  this medicine contact a poison control center or emergency room at once. NOTE: This medicine is only for you. Do not share this medicine with others. What if I miss a dose? You will need to replace your vaginal ring once a month as directed. If the ring should slip out, or if you leave it in longer or shorter than you should, contact your health care professional for advice. What may interact with this medicine? -acetaminophen -antibiotics or medicines for infections, especially rifampin, rifabutin, rifapentine, and griseofulvin, and possibly penicillins or tetracyclines -aprepitant -ascorbic acid (vitamin C) -atorvastatin -barbiturate medicines, such as phenobarbital -bosentan -carbamazepine -caffeine -clofibrate -cyclosporine -dantrolene -doxercalciferol -felbamate -grapefruit juice -hydrocortisone -medicines for anxiety or sleeping problems, such as diazepam or temazepam -medicines for diabetes, including pioglitazone -modafinil -mycophenolate -nefazodone -oxcarbazepine -phenytoin -prednisolone -ritonavir or other medicines for HIV infection or AIDS -rosuvastatin -selegiline -soy isoflavones supplements -St. John's wort -tamoxifen or raloxifene -theophylline -thyroid hormones -topiramate -warfarin This list may not describe all possible interactions. Give your health care provider a list of all the medicines, herbs, non-prescription drugs, or dietary supplements you use. Also tell them if you smoke, drink alcohol, or use illegal drugs. Some items may interact with your medicine. What should I watch for while using this medicine? Visit your doctor or health care professional for regular checks on your progress. You will need a regular breast and pelvic exam and Pap smear while on this medicine. Use an additional method of contraception during the first cycle that you use this ring. If you have any reason to think you are pregnant, stop using this medicine right away and  contact your doctor or health care professional. If you are  using this medicine for hormone related problems, it may take several cycles of use to see improvement in your condition. Smoking increases the risk of getting a blood clot or having a stroke while you are using hormonal birth control, especially if you are more than 29 years old. You are strongly advised not to smoke. This medicine can make your body retain fluid, making your fingers, hands, or ankles swell. Your blood pressure can go up. Contact your doctor or health care professional if you feel you are retaining fluid. This medicine can make you more sensitive to the sun. Keep out of the sun. If you cannot avoid being in the sun, wear protective clothing and use sunscreen. Do not use sun lamps or tanning beds/booths. If you wear contact lenses and notice visual changes, or if the lenses begin to feel uncomfortable, consult your eye care specialist. In some women, tenderness, swelling, or minor bleeding of the gums may occur. Notify your dentist if this happens. Brushing and flossing your teeth regularly may help limit this. See your dentist regularly and inform your dentist of the medicines you are taking. If you are going to have elective surgery, you may need to stop using this medicine before the surgery. Consult your health care professional for advice. This medicine does not protect you against HIV infection (AIDS) or any other sexually transmitted diseases. What side effects may I notice from receiving this medicine? Side effects that you should report to your doctor or health care professional as soon as possible: -breast tissue changes or discharge -changes in vaginal bleeding during your period or between your periods -chest pain -coughing up blood -dizziness or fainting spells -headaches or migraines -leg, arm or groin pain -severe or sudden headaches -stomach pain (severe) -sudden shortness of breath -sudden loss of  coordination, especially on one side of the body -speech problems -symptoms of vaginal infection like itching, irritation or unusual discharge -tenderness in the upper abdomen -vomiting -weakness or numbness in the arms or legs, especially on one side of the body -yellowing of the eyes or skin Side effects that usually do not require medical attention (report to your doctor or health care professional if they continue or are bothersome): -breakthrough bleeding and spotting that continues beyond the 3 initial cycles of pills -breast tenderness -mood changes, anxiety, depression, frustration, anger, or emotional outbursts -increased sensitivity to sun or ultraviolet light -nausea -skin rash, acne, or brown spots on the skin -weight gain (slight) This list may not describe all possible side effects. Call your doctor for medical advice about side effects. You may report side effects to FDA at 1-800-FDA-1088. Where should I keep my medicine? Keep out of the reach of children. Store at room temperature between 15 and 30 degrees C (59 and 86 degrees F) for up to 4 months. The product will expire after 4 months. Protect from light. Throw away any unused medicine after the expiration date. NOTE: This sheet is a summary. It may not cover all possible information. If you have questions about this medicine, talk to your doctor, pharmacist, or health care provider.    2016, Elsevier/Gold Standard. (2007-12-17 12:03:58)

## 2015-01-12 NOTE — Progress Notes (Signed)
29 y.o. G0P0000 Single Hispanic female here for pelvic ultrasound due to DUB and increased cramping since Skyla IUD was placed 2/16.  Pt was seen 01/06/15 by Ria CommentPatricia Grubb and she reported that she was not having a typical menstrual flow any longer but was spotting for up to a week on two separate occasions each month.  With this bleedings, she's also having increased cramping.  Denies vaginal discharge or odor.  Denies pain with intercourse.  Patient's last menstrual period was 01/06/2015.  Contraception: Skyla IUD  Findings:  UTERUS: 6.7 x 4.0 x 3.0cm EMS: 5.908mm with IUD in correct location ADNEXA: Left ovary:  3.2 x 1.3 x 2.2cm       Right ovary: 3.3 x 1.7 x 1.0cm.  Multiple follicles bilaterally noted CUL DE SAC: no free fluid  D/W pt findings.  Images reviewed.  Irregular bleeding is most likely due to IUD.  She really likes the ease and reliability of the IUD and would really like to keep using this method.  D/W pt use of OCPs for a few months to see if this will regulate bleeding.  She could then stop and see if the irregular bleeding returns.  If it does, she knows it is related to the IUD.  She could then decide if she wants [facility].  Other options such as Depo Provera, nexplanon, POPs, OCPs, and Nuva ring reviewed.  Pt hasn't been on pills since late teens/early 20's so she can't think of what she took before.  Headache, nausea, increased BP, DVT, PE reviewed.  Pt aware to call with any concern.  Assessment:  DUB with Skyla IUD Plan:  Pt will consider trial of OCPs for a couple of months.  If this helps, she can then stop and see how bleeding profile is after use.  If it doesn't improve, she will plan to return and discuss additional options.  Declines IUD removal today.  Rx for Loestrin 1/20 given for three months.  ~20 minutes spent with patient >50% of time was in face to face discussion of above.

## 2015-01-17 ENCOUNTER — Ambulatory Visit: Payer: BC Managed Care – PPO | Admitting: Nurse Practitioner

## 2015-02-11 ENCOUNTER — Other Ambulatory Visit: Payer: Self-pay | Admitting: Nurse Practitioner

## 2015-02-13 NOTE — Telephone Encounter (Deleted)
Medication refill request: Zovirax  Last AEX:  *** Next AEX: *** Last MMG (if hormonal medication request): *** Refill authorized: ***

## 2015-02-13 NOTE — Telephone Encounter (Signed)
Medication refill request: Valtrex Last AEX: 01/06/2015 PG Next AEX: 01/29/2016 PG Last MMG (if hormonal medication request): None Refill authorized: 01/04/2014 #90 tabs 3 Refills Discontinued  Zovirax 400 mg authorized 01/06/2015 #60 tabs 12 Refills  Valtrex Refused: Rx refill not appropriate

## 2015-04-07 ENCOUNTER — Other Ambulatory Visit: Payer: Self-pay | Admitting: Obstetrics & Gynecology

## 2015-04-07 NOTE — Telephone Encounter (Signed)
RF was completed.  How is her bleeding?  She was going to take this for a few months and see about bleeding pattern.  She has an IUD an irregular bleeding with the IUD.  It is ok to use both but if she decides she want to have her IUD removed, she just needs to let us know.  Also, she can go off the OCPs if cycles have normalized, just to see what will happen.  I did the RF for the year in case she just decides to use both methods.

## 2015-04-07 NOTE — Telephone Encounter (Signed)
Left Message To Call Back  

## 2015-04-07 NOTE — Telephone Encounter (Signed)
Patient calling to check on the status of the request. She asks for a call back to confirm when prescription is sent in and said it is okay to leave any details on her voicemail.

## 2015-04-07 NOTE — Telephone Encounter (Signed)
Patient returned my call she [facility], her cycle lasts for about a week. When she stops taking her BC pill then her cycle will start. Patient says she will continue to have her IUD in and take her BC pills as well. She is aware to give our office a call if she ever decides she wants the IUD out. Patient also aware that refill has been sent in.

## 2015-04-07 NOTE — Telephone Encounter (Signed)
Medication refill request: Microgestin  Last AEX:  01/06/15 PG Next AEX: 01/29/16 PG Last MMG (if hormonal medication request):  Refill authorized: 01/12/15 #1pack/2R. Today please advise.

## 2015-05-02 ENCOUNTER — Telehealth: Payer: Self-pay | Admitting: Obstetrics & Gynecology

## 2015-05-02 NOTE — Telephone Encounter (Signed)
I would recommend pt see dermatology before she stops her pills.  It is unlikely that she's been on them this long and now starts to have a problem.  Allergic reaction is not that common, either.

## 2015-05-02 NOTE — Telephone Encounter (Signed)
Patient thinks she may be having an allergic reaction to her birth control.

## 2015-05-02 NOTE — Telephone Encounter (Signed)
Spoke with patient. Patient states that she has been taking Microgestin Fe for 2-3 months. Reports she has developed "hive like bumps" on her face that are itchy. These began 2 weeks ago and have not gone away. Denies taking any new medication, changes to her diet, or any changes to her soaps,detergents, or shampoo. "The only thing I can think of is my birth control." Reports she did travel out of the country and took "malaria pills" for 1 week prior to leaving which was in March. States she [place] to the [place] the third week of March. Denies any other symptoms. States that she has never had this happen before. Patient would like to remain on OCP. "I just didn't know if this could be because of the birth control." Advised I will speak with Dr.Miller and return call with further recommendations. She is agreeable.

## 2015-05-03 NOTE — Telephone Encounter (Signed)
Left message to call Azelie Noguera at [phone]. 

## 2015-05-03 NOTE — Telephone Encounter (Addendum)
Spoke with [facility]. First available appointment close to 2 pm is on 05/08/2015 at 1:45 pm. I have scheduled this appointment for the patient. She will be seeing Paticia StackStuart Macdonnell, PA.  Left detailed message at number provided [phone], okay per ROI. Advised of appointment scheduled at [facility] on 05/08/2015 at 1:45 pm with Sandrea HughsStuart Macdonell PA. Advised the address is [address] [place], [place] [address]. Telephone number to their office if she needs to reschedule her appointment is [phone].   Routing to provider for final review. Patient agreeable to disposition. Will close encounter.

## 2015-05-03 NOTE — Telephone Encounter (Signed)
Spoke with patient. Advised of message as seen below from Dr.Miller. She is agreeable. Would like for me to schedule her an appointment with Dermatology and return call with appointment date and time. Reports she is available to be seen any day after 2 pm. Advised I will contact [facility] and schedule an appointment. She is agreeable and verbalizes understanding.   Call to [facility]. Left message to return call regarding scheduling new patient appointment.

## 2015-05-08 ENCOUNTER — Ambulatory Visit (INDEPENDENT_AMBULATORY_CARE_PROVIDER_SITE_OTHER): Payer: BC Managed Care – PPO | Admitting: Nurse Practitioner

## 2015-05-08 ENCOUNTER — Encounter: Payer: Self-pay | Admitting: Nurse Practitioner

## 2015-05-08 VITALS — BP 104/70 | HR 76 | Temp 98.1°F | Resp 16 | Ht 63.5 in | Wt 128.0 lb

## 2015-05-08 DIAGNOSIS — N760 Acute vaginitis: Secondary | ICD-10-CM | POA: Diagnosis not present

## 2015-05-08 DIAGNOSIS — R197 Diarrhea, unspecified: Secondary | ICD-10-CM

## 2015-05-08 MED ORDER — CIPROFLOXACIN HCL 500 MG PO TABS: 500.0000 mg | ORAL_TABLET | Freq: Two times a day (BID) | ORAL | Status: DC

## 2015-05-08 MED ORDER — FLUCONAZOLE 150 MG PO TABS: 150.0000 mg | ORAL_TABLET | Freq: Once | ORAL | Status: DC

## 2015-05-08 MED ORDER — ACYCLOVIR 400 MG PO TABS: 400.0000 mg | ORAL_TABLET | Freq: Two times a day (BID) | ORAL | Status: DC

## 2015-05-08 NOTE — Patient Instructions (Signed)
OTC Aveeno bath salts OTC Imodium AD   Take Cipro twice a day for diarrhea At the end of antibiotic take the Diflucan for yeast infection

## 2015-05-08 NOTE — Progress Notes (Signed)
30 y.o. Single Hispanic female G0P0000 here with complaint of vaginal symptoms of itching, burning, and increase discharge. Describes discharge as white with an odor. Onset of symptoms about 1 month ago.  She had burning and vaginal itching and was treated with Diflucan about 3 weeks ago with no help.     Denies new personal products or vaginal dryness.  Just finished menses and tampons were very uncomfortable.   New partner for 2 months and desires GC and Chlamydia testing.  Urinary symptoms none . Contraception is OCP. She also has other complaints about abdominal pain, cramps, and diarrhea since her return for trip to [place].  She can average 5-6 diarrhea per day - does not awaken her at night.  no sign of blood or mucous.  During her travel to [place] she was taking malaria medication - Mefloquine and she did take Cipro but did not finish antibiotics.  Now those symptoms have worsened and she no further med's left as she disposed of them.  She tried to see PCP this am and they had no openings for a week.   O:  Healthy female WDWN Affect: normal, orientation x 3  Exam: no distress Abdomen: soft but very active BS in all quadrants.  Slight tender lower abdomen to deep palpation, no flank pain. Lymph node: no enlargement or tenderness Pelvic exam: External genital: normal female BUS: negative Vagina: white clear and thick discharge noted.  Affirm taken. Cervix: normal, non tender, no CMT Uterus: normal, non tender Adnexa:normal, non tender, no masses or fullness noted    A: Vaginitis  R/O STD's  Travelers diarrhea   P: Discussed findings of vaginitis and etiology. Discussed Aveeno or baking soda sitz bath for comfort. Avoid moist clothes or pads for extended period of time. If working out in gym clothes or swim suits for long periods of time change underwear or bottoms of swimsuit if possible. Olive Oil/Coconut Oil use for skin protection prior to activity can be used to external skin.  Rx:  will restart her on Cipro 500 mg BID # 14  OTC Imodium AD prn  She is aware that she will need to see PCP if any symptoms change - such as blood or mucous, fever or chills, etc  Did give her RX for Diflucan to take after Cipro if needed  Will call her with all test results  Follow with Affirm  She is given a refill on Zovirax since her app says no refills available.  RV prn

## 2015-05-09 LAB — WET PREP BY MOLECULAR PROBE
CANDIDA SPECIES: POSITIVE — AB
GARDNERELLA VAGINALIS: NEGATIVE
Trichomonas vaginosis: NEGATIVE

## 2015-05-09 LAB — STD PANEL
HIV: NONREACTIVE
Hepatitis B Surface Ag: NEGATIVE

## 2015-05-10 LAB — IPS N GONORRHOEA AND CHLAMYDIA BY PCR

## 2015-05-12 NOTE — Progress Notes (Signed)
Encounter reviewed by Dr. Brook Amundson C. Silva.  

## 2015-05-18 ENCOUNTER — Ambulatory Visit: Payer: BC Managed Care – PPO | Admitting: Allergy and Immunology

## 2015-07-05 ENCOUNTER — Telehealth: Payer: Self-pay | Admitting: Nurse Practitioner

## 2015-07-05 NOTE — Telephone Encounter (Signed)
Patient states she is out of town and forgot her birth control pills at home. Requesting prescription sent to a local pharmacy.  [facility] [address] [place] [phone] phone  Did not take yesterday or today and [place] [place]  Patient [phone]

## 2015-07-05 NOTE — Telephone Encounter (Signed)
Call to patient. Advised to have [facility] [place] [facility] her [facility] [place] and transfer prescription. May need vacation over ride or cash pay if insurance requires.  Patient states she is aware to take two missed pills today and may have some breakthrough bleeding, use back up method for contraception.  She is advised to call back with any concerns or questions.  Routing to provider for final review. Patient agreeable to disposition. Will close encounter.

## 2015-08-24 ENCOUNTER — Encounter: Payer: Self-pay | Admitting: Obstetrics and Gynecology

## 2015-08-24 ENCOUNTER — Ambulatory Visit (INDEPENDENT_AMBULATORY_CARE_PROVIDER_SITE_OTHER): Payer: BC Managed Care – PPO | Admitting: Obstetrics and Gynecology

## 2015-08-24 VITALS — BP 124/80 | HR 68 | Ht 63.5 in | Wt 123.4 lb

## 2015-08-24 DIAGNOSIS — R7309 Other abnormal glucose: Secondary | ICD-10-CM | POA: Diagnosis not present

## 2015-08-24 DIAGNOSIS — N390 Urinary tract infection, site not specified: Secondary | ICD-10-CM | POA: Diagnosis not present

## 2015-08-24 DIAGNOSIS — R300 Dysuria: Secondary | ICD-10-CM | POA: Diagnosis not present

## 2015-08-24 DIAGNOSIS — R82998 Other abnormal findings in urine: Secondary | ICD-10-CM

## 2015-08-24 DIAGNOSIS — N949 Unspecified condition associated with female genital organs and menstrual cycle: Secondary | ICD-10-CM | POA: Diagnosis not present

## 2015-08-24 DIAGNOSIS — N760 Acute vaginitis: Secondary | ICD-10-CM

## 2015-08-24 LAB — POCT URINALYSIS DIPSTICK
BILIRUBIN UA: NEGATIVE
GLUCOSE UA: NEGATIVE
Ketones, UA: NEGATIVE
Nitrite, UA: NEGATIVE
PH UA: 8
Protein, UA: NEGATIVE
RBC UA: NEGATIVE
Urobilinogen, UA: NEGATIVE

## 2015-08-24 NOTE — Progress Notes (Signed)
GYNECOLOGY  VISIT   HPI: 30 y.o.   Single  Hispanic  female   G0P0000 with No LMP recorded.   here for vaginal burning and dysuria. Patient states she is having recurrent vaginal infections. She has been tested on several occasions for STDs but results have been negative. She most recently has treated herself with Monistat 1-Day without relief.  States symptoms started in March after trip to [place] when she took Cipro. Symptoms recurring since then.  Been to the doctor 7 times.  Unable to use tampons due to pain.  Sex is also painful.   States her symptoms are itching and burning.  Some odor.   Some pressure to urinate. Frequency.  ? Hematuria.  Resolved.  Hx of UTIs once or twice a year.  Took Ciprofloxacin for chigger bites while in [place] 2 weeks ago.   Likes to work out.  Hx HSV. Outbreaks with increase in stress - 3 - 4 times per school year.   Seen 05/08/15 and had yeast noted on Affirm testing.  Tx with Diflucan. GC/CT both negative.   Has Skyla IUD and started additional OCPs in January or February 2017 due to break through bleeding. Bleeding is now controlled and has monthly menses. Cramping has increased since the [facility] placed.   Has blood glucose checked one year ago and was told she has borderline blood sugar.  Father has adult onset DM.  Abdominal bloating.  Lactose intolerant.  Irregular bowel movements.  Urine Dip:  2+ WBCs  GYNECOLOGIC HISTORY: No LMP recorded. Contraception:  Skyla IUD inserted 02-17-14 and Microgestin Menopausal hormone therapy:  n/a Last mammogram:  n/a Last pap smear:   01-06-15 Neg        OB History    Gravida Para Term Preterm AB Living   0 0 0 0 0 0   SAB TAB Ectopic Multiple Live Births   0 0 0 0           Patient Active Problem List   Diagnosis Date Noted  . IUD (intrauterine device) in place 01/12/2015    Past Medical History:  Diagnosis Date  . HSV-2 (herpes simplex virus 2) infection 2014    Past Surgical  History:  Procedure Laterality Date  . CHOLECYSTECTOMY  2005    Current Outpatient Prescriptions  Medication Sig Dispense Refill  . acyclovir (ZOVIRAX) 400 MG tablet Take 1 tablet (400 mg total) by mouth 2 (two) times daily. 60 tablet 12  . fluticasone (FLONASE) 50 MCG/ACT nasal spray Place 2 sprays into both nostrils daily.   1  . Levonorgestrel (SKYLA) 13.5 MG IUD by Intrauterine route.    Marland Kitchen. MICROGESTIN FE 1/20 1-20 MG-MCG tablet TAKE 1 TABLET BY MOUTH EVERY DAY 1 Package 10  . montelukast (SINGULAIR) 10 MG tablet Take 10 mg by mouth at bedtime.   1   No current facility-administered medications for this visit.      ALLERGIES: Review of patient's allergies indicates no known allergies.  Family History  Problem Relation Age of Onset  . Osteoarthritis Mother   . Osteoarthritis Maternal Grandfather   . Diabetes Father 3855    type 2    Social History   Social History  . Marital status: Single    Spouse name: N/A  . Number of children: N/A  . Years of education: N/A   Occupational History  . Not on file.   Social History Main Topics  . Smoking status: Never Smoker  . Smokeless tobacco: Never  Used  . Alcohol use 0.0 oz/week     Comment: socially  . Drug use: No  . Sexual activity: Yes    Birth control/ protection: IUD, Pill     Comment: Skyla inserted 02/17/14   Other Topics Concern  . Not on file   Social History Narrative  . No narrative on file    ROS:  Pertinent items are noted in HPI.  PHYSICAL EXAMINATION:    BP 124/80 (BP Location: Right Arm, Patient Position: Sitting, Cuff Size: Normal)   Pulse 68   Ht 5' 3.5" (1.613 m)   Wt 123 lb 6.4 oz (56 kg)   BMI 21.52 kg/m     General appearance: alert, cooperative and appears stated age   Pelvic: External genitalia:  no lesions              Urethra:  normal appearing urethra with no masses, tenderness or lesions              Bartholins and Skenes: normal                 Vagina: normal appearing vagina with  thick white discharge.              Cervix: no lesions.  IUD strings seen.                 Bimanual Exam:  Uterus:  normal size, contour, position, consistency, mobility, non-tender              Adnexa: no mass, fullness, tenderness                Chaperone was present for exam.  ASSESSMENT  Skyla patient. Reurrent vaginitis. I suspect this is yeast.  Bladder pressure.  On combined OCPs for breakthrough bleeding.  Hx elevated blood sugar.  Abdominal bloating.  PLAN  Affirm, urine culture.  Final tx to follow. Discussed risk factors for vaginitis - warm wet gym clothes, combined OCPs, recent abx, elevated blood sugar.  Will check Hgb A1C now. Discussed use of probiotics.  Patient is considering discontinuation of combined oral contraceptives. Stool softener, increased fiber, and probiotics discussed for improved bowel function.    An After Visit Summary was printed and given to the patient.  __25____ minutes face to face time of which over 50% was spent in counseling.

## 2015-08-25 ENCOUNTER — Other Ambulatory Visit: Payer: Self-pay | Admitting: Obstetrics and Gynecology

## 2015-08-25 ENCOUNTER — Encounter: Payer: Self-pay | Admitting: Obstetrics and Gynecology

## 2015-08-25 ENCOUNTER — Telehealth: Payer: Self-pay

## 2015-08-25 LAB — HEMOGLOBIN A1C
HEMOGLOBIN A1C: 5.5 % (ref <5.7)
MEAN PLASMA GLUCOSE: 111 mg/dL

## 2015-08-25 LAB — URINE CULTURE: Organism ID, Bacteria: NO GROWTH

## 2015-08-25 LAB — WET PREP BY MOLECULAR PROBE
Candida species: POSITIVE — AB
GARDNERELLA VAGINALIS: NEGATIVE
TRICHOMONAS VAG: NEGATIVE

## 2015-08-25 MED ORDER — FLUCONAZOLE 150 MG PO TABS: 150.0000 mg | ORAL_TABLET | Freq: Once | ORAL | 1 refills | Status: AC

## 2015-08-25 MED ORDER — NORETHIN-ETH ESTRAD-FE BIPHAS 1 MG-10 MCG / 10 MCG PO TABS: 1.0000 | ORAL_TABLET | Freq: Every day | ORAL | 2 refills | Status: DC

## 2015-08-25 NOTE — Telephone Encounter (Signed)
Rx sent to pharmacy for LoLoEstrin for 3 month trial.

## 2015-08-25 NOTE — Addendum Note (Signed)
Addended by: Ardell IsaacsAMUNDSON C SILVA, Debbe BalesBROOK E on: 08/25/2015 06:16 AM   Modules accepted: Orders

## 2015-08-25 NOTE — Telephone Encounter (Signed)
Spoke with patient and given results of labs and affirm.  Patient will begin on Diflucan but also would like to change to LoLoEstrin in the meantime. Please order. Routed to provider

## 2015-08-25 NOTE — Telephone Encounter (Signed)
-----   Message from Patton SallesBrook E Amundson C Silva, MD sent at 08/25/2015  6:15 AM EDT ----- Please report Affirm and hemoglobin A1C testing to patient.  She does have yeast.  I am recommending Diflucan 150 mg po x 1 and repeat in 72 hours if needed.  I am sending this through to her pharmacy.    Her hemoglobin A1C is normal, so I don't think her blood glucose level is contributing to the yeast infections.   She just took an abx, which is the most likely recent risk factor.   Her choice if she would like a trial of coming off her OCPs.  Her IUD will continue to give her good contraception and we can just monitor her bleeding profile.  She knows there is a pill with a lower estrogen dosage, LoLoEstrin.

## 2015-08-28 NOTE — Telephone Encounter (Signed)
Called patient at [phone] and per DPR, left detailed message stating we called in her new RX to her pharmacy.

## 2016-01-26 ENCOUNTER — Encounter: Payer: Self-pay | Admitting: Nurse Practitioner

## 2016-01-26 NOTE — Progress Notes (Signed)
Patient ID: Ashley Dixon, female   DOB: 6/Ashley Ade11/1987, 31 y.o.   MRN: [id number]  31 y.o. G0P0000 Single  Hispanic Fe here for annual exam.  She had AUB on IUD and was put on OCP for about 5 months. She then stopped OCP due to chronic yeast vaginitis. Still had some symptoms earlier last week.  menses still about 5 days, moderate for 3 days then light.  Same partner X 12 yrs.  No concerns about STD's. [name] is due to come out 02/2017 and may consider [name] IUD.  Still no decision about pregnancy.  Patient's last menstrual period was 01/22/2016.          Sexually active: Yes.    The current method of family planning is condoms sometimes and IUD.  Ashley Dixon inserted 02/17/14.  Same partner x 12 years. Exercising: Yes.    Gym/ health club routine includes cardio and weight lifting. Smoker:  no  Health Maintenance: Pap: 01/06/15, Negative TDaP: 01/06/15 HIV: 05/08/15 Labs: HB: 12.6   Urine: Negative   reports that she has never smoked. She has never used smokeless tobacco. She reports that she drinks alcohol. She reports that she does not use drugs.  Past Medical History:  Diagnosis Date  . HSV-2 (herpes simplex virus 2) infection 2014    Past Surgical History:  Procedure Laterality Date  . CHOLECYSTECTOMY  2005    Current Outpatient Prescriptions  Medication Sig Dispense Refill  . acyclovir (ZOVIRAX) 400 MG tablet Take 1 tablet (400 mg total) by mouth 2 (two) times daily. 60 tablet 12  . fluticasone (FLONASE) 50 MCG/ACT nasal spray Place 2 sprays into both nostrils daily.   1  . Levonorgestrel (Ashley Dixon) 13.5 MG IUD by Intrauterine route.    . montelukast (SINGULAIR) 10 MG tablet Take 10 mg by mouth at bedtime.   1  . fluconazole (DIFLUCAN) 150 MG tablet Take 1 tablet (150 mg total) by mouth once. Take one tablet.  Repeat in 48 hours if symptoms are not completely resolved. 2 tablet 2   No current facility-administered medications for this visit.     Family History  Problem Relation Age of  Onset  . Osteoarthritis Mother   . Osteoarthritis Maternal Grandfather   . Diabetes Father 1955    type 2    ROS:  Pertinent items are noted in HPI.  Otherwise, a comprehensive ROS was negative.  Exam:   BP 104/66 (BP Location: Right Arm, Patient Position: Sitting, Cuff Size: Normal)   Pulse 64   Ht 5' 3.5" (1.613 m)   Wt 129 lb (58.5 kg)   LMP 01/22/2016   BMI 22.49 kg/m  Height: 5' 3.5" (161.3 cm) Ht Readings from Last 3 Encounters:  01/29/16 5' 3.5" (1.613 m)  08/24/15 5' 3.5" (1.613 m)  05/08/15 5' 3.5" (1.613 m)    General appearance: alert, cooperative and appears stated age Head: Normocephalic, without obvious abnormality, atraumatic Neck: no adenopathy, supple, symmetrical, trachea midline and thyroid normal to inspection and palpation Lungs: clear to auscultation bilaterally Breasts: normal appearance, no masses or tenderness Heart: regular rate and rhythm Abdomen: soft, non-tender; no masses,  no organomegaly Extremities: extremities normal, atraumatic, no cyanosis or edema Skin: Skin color, texture, turgor normal. No rashes or lesions Lymph nodes: Cervical, supraclavicular, and axillary nodes normal. No abnormal inguinal nodes palpated Neurologic: Grossly normal   Pelvic: External genitalia:  no lesions              Urethra:  normal appearing urethra with no  masses, tenderness or lesions              Bartholin's and Skene's: normal                 Vagina: normal appearing vagina with normal color and discharge, no lesions              Cervix: anteverted, string barely seen as it is curled up              Pap taken: Yes.  per request for insurance forms Bimanual Exam:  Uterus:  normal size, contour, position, consistency, mobility, non-tender              Adnexa: no mass, fullness, tenderness               Rectovaginal: Confirms               Anus:  normal sphincter tone, no lesions  Chaperone present: yes  A:  Well Woman with normal exam  Ashley Dixon IUD  2/4/2016with prolonged menses- improved on OCP              R/O vaginitis              P:   Reviewed health and wellness pertinent to exam  Pap smear was done  Will follow with labs  Refill on Diflucan due to chronic nature of her yeast - will follow with labs  Refill on Acyclovir for a year.  Counseled on breast self exam, STD prevention, adequate intake of calcium and vitamin D, diet and exercise return annually or prn  An After Visit Summary was printed and given to the patient.

## 2016-01-29 ENCOUNTER — Encounter: Payer: Self-pay | Admitting: Nurse Practitioner

## 2016-01-29 ENCOUNTER — Ambulatory Visit (INDEPENDENT_AMBULATORY_CARE_PROVIDER_SITE_OTHER): Payer: BC Managed Care – PPO | Admitting: Nurse Practitioner

## 2016-01-29 VITALS — BP 104/66 | HR 64 | Ht 63.5 in | Wt 129.0 lb

## 2016-01-29 DIAGNOSIS — Z0000 Encounter for general adult medical examination without abnormal findings: Secondary | ICD-10-CM

## 2016-01-29 DIAGNOSIS — Z30431 Encounter for routine checking of intrauterine contraceptive device: Secondary | ICD-10-CM

## 2016-01-29 DIAGNOSIS — Z113 Encounter for screening for infections with a predominantly sexual mode of transmission: Secondary | ICD-10-CM

## 2016-01-29 DIAGNOSIS — Z01419 Encounter for gynecological examination (general) (routine) without abnormal findings: Secondary | ICD-10-CM

## 2016-01-29 DIAGNOSIS — N760 Acute vaginitis: Secondary | ICD-10-CM

## 2016-01-29 LAB — POCT URINALYSIS DIPSTICK
BILIRUBIN UA: NEGATIVE
Blood, UA: NEGATIVE
Glucose, UA: NEGATIVE
Ketones, UA: NEGATIVE
LEUKOCYTES UA: NEGATIVE
NITRITE UA: NEGATIVE
PH UA: 5
Protein, UA: NEGATIVE
Urobilinogen, UA: NEGATIVE

## 2016-01-29 LAB — HEMOGLOBIN A1C
Hgb A1c MFr Bld: 5.4 % (ref <5.7)
MEAN PLASMA GLUCOSE: 108 mg/dL

## 2016-01-29 LAB — LIPID PANEL
CHOLESTEROL: 150 mg/dL (ref <200)
HDL: 43 mg/dL — ABNORMAL LOW (ref >50)
LDL CALC: 77 mg/dL (ref <100)
TRIGLYCERIDES: 152 mg/dL — AB (ref <150)
Total CHOL/HDL Ratio: 3.5 Ratio (ref <5.0)
VLDL: 30 mg/dL (ref <30)

## 2016-01-29 LAB — TSH: TSH: 2.4 mIU/L

## 2016-01-29 MED ORDER — FLUCONAZOLE 150 MG PO TABS: 150.0000 mg | ORAL_TABLET | Freq: Once | ORAL | 2 refills | Status: AC

## 2016-01-29 MED ORDER — ACYCLOVIR 400 MG PO TABS: 400.0000 mg | ORAL_TABLET | Freq: Two times a day (BID) | ORAL | 12 refills | Status: DC

## 2016-01-29 NOTE — Patient Instructions (Signed)

## 2016-01-30 LAB — GC/CHLAMYDIA PROBE AMP
CT Probe RNA: NOT DETECTED
GC Probe RNA: NOT DETECTED

## 2016-01-30 LAB — VITAMIN D 25 HYDROXY (VIT D DEFICIENCY, FRACTURES): Vit D, 25-Hydroxy: 24 ng/mL — ABNORMAL LOW (ref 30–100)

## 2016-01-30 LAB — WET PREP BY MOLECULAR PROBE
CANDIDA SPECIES: NEGATIVE
Gardnerella vaginalis: NEGATIVE
Trichomonas vaginosis: NEGATIVE

## 2016-01-31 LAB — IPS PAP SMEAR ONLY

## 2016-02-01 NOTE — Progress Notes (Signed)
Encounter reviewed by Dr. Draco Malczewski Amundson C. Silva.  

## 2016-02-06 LAB — HEMOGLOBIN, FINGERSTICK: Hemoglobin, fingerstick: 12.6 g/dL (ref 12.0–15.0)

## 2016-07-04 ENCOUNTER — Other Ambulatory Visit: Payer: Self-pay | Admitting: Obstetrics & Gynecology

## 2016-07-04 NOTE — Telephone Encounter (Signed)
Medication refill request: microgestin 1/20 Last AEX:  01-29-16 Next AEX: 02-03-17 Last MMG (if hormonal medication request): none Refill authorized: pt has iud & was previously put on pill for AUB for 5mths then stopped due to chronic yeast infection. Please approve or deny if pt refill is appropriate.

## 2016-07-05 NOTE — Telephone Encounter (Signed)
Can you please call the pharmacy?  I don't think the pt is taking this any longer.  Thanks.

## 2016-07-09 NOTE — Telephone Encounter (Signed)
Left message for patient to callback to see if she is still taking this.

## 2016-07-10 NOTE — Telephone Encounter (Signed)
Per pharmacy, pt hasnt had this refilled since 6/17. rx denied with message for patient to call office if any questions.

## 2017-02-03 ENCOUNTER — Ambulatory Visit: Payer: BC Managed Care – PPO | Admitting: Nurse Practitioner

## 2017-02-04 ENCOUNTER — Other Ambulatory Visit: Payer: Self-pay

## 2017-02-04 ENCOUNTER — Encounter: Payer: Self-pay | Admitting: Obstetrics & Gynecology

## 2017-02-04 ENCOUNTER — Other Ambulatory Visit (HOSPITAL_COMMUNITY)
Admission: RE | Admit: 2017-02-04 | Discharge: 2017-02-04 | Disposition: A | Discharge status: Home / Self Care | Payer: BC Managed Care – PPO | Source: Ambulatory Visit | Attending: Obstetrics & Gynecology | Admitting: Obstetrics & Gynecology

## 2017-02-04 ENCOUNTER — Ambulatory Visit: Payer: BC Managed Care – PPO | Admitting: Obstetrics & Gynecology

## 2017-02-04 VITALS — BP 100/60 | HR 84 | Resp 16 | Ht 63.25 in | Wt 132.0 lb

## 2017-02-04 DIAGNOSIS — Z0000 Encounter for general adult medical examination without abnormal findings: Secondary | ICD-10-CM

## 2017-02-04 DIAGNOSIS — Z01419 Encounter for gynecological examination (general) (routine) without abnormal findings: Secondary | ICD-10-CM | POA: Diagnosis not present

## 2017-02-04 DIAGNOSIS — Z30433 Encounter for removal and reinsertion of intrauterine contraceptive device: Secondary | ICD-10-CM

## 2017-02-04 DIAGNOSIS — Z124 Encounter for screening for malignant neoplasm of cervix: Secondary | ICD-10-CM

## 2017-02-04 MED ORDER — ACYCLOVIR 400 MG PO TABS: 400.0000 mg | ORAL_TABLET | Freq: Two times a day (BID) | ORAL | 12 refills | Status: DC

## 2017-02-04 MED ORDER — MISOPROSTOL 200 MCG PO TABS: ORAL_TABLET | ORAL | 0 refills | Status: DC

## 2017-02-04 NOTE — Progress Notes (Addendum)
32 y.o. G0P0000 SingleHispanicF here for annual exam.  Doing well.  Using Skyla IUD that is due for removal and replacement in February.  Having more yeast and BV issues with the Skyla IUD.  IUD removal due in early February.  Still desires IUD.  Wants to discuss options.  Patient's last menstrual period was 01/15/2017.          Sexually active: Yes.    The current method of family planning is IUD.  Skyla placed 02/17/14  Exercising: Yes.    cardio Smoker:  no  Health Maintenance: Pap:  01/29/16 Neg  01/06/15 Neg  History of abnormal Pap:  yes MMG: Never TDaP:  2016 Screening Labs: Here today.    reports that  has never smoked. she has never used smokeless tobacco. She reports that she drinks alcohol. She reports that she does not use drugs.  Past Medical History:  Diagnosis Date  . HSV-2 (herpes simplex virus 2) infection 2014    Past Surgical History:  Procedure Laterality Date  . CHOLECYSTECTOMY  2005    Current Outpatient Medications  Medication Sig Dispense Refill  . acyclovir (ZOVIRAX) 400 MG tablet Take 1 tablet (400 mg total) by mouth 2 (two) times daily. 60 tablet 12  . fluticasone (FLONASE) 50 MCG/ACT nasal spray Place 2 sprays into both nostrils daily.   1  . Levonorgestrel (SKYLA) 13.5 MG IUD by Intrauterine route.    . montelukast (SINGULAIR) 10 MG tablet Take 10 mg by mouth at bedtime.   1   No current facility-administered medications for this visit.     Family History  Problem Relation Age of Onset  . Osteoarthritis Mother   . Osteoarthritis Maternal Grandfather   . Diabetes Father 19       type 2    ROS:  Pertinent items are noted in HPI.  Otherwise, a comprehensive ROS was negative.  Exam:   BP 100/60 (BP Location: Right Arm, Patient Position: Sitting, Cuff Size: Normal)   Pulse 84   Resp 16   Ht 5' 3.25" (1.607 m)   Wt 132 lb (59.9 kg)   LMP 01/15/2017   BMI 23.20 kg/m    Height: 5' 3.25" (160.7 cm)  Ht Readings from Last 3 Encounters:   02/04/17 5' 3.25" (1.607 m)  01/29/16 5' 3.5" (1.613 m)  08/24/15 5' 3.5" (1.613 m)    General appearance: alert, cooperative and appears stated age Head: Normocephalic, without obvious abnormality, atraumatic Neck: no adenopathy, supple, symmetrical, trachea midline and thyroid normal to inspection and palpation Lungs: clear to auscultation bilaterally Breasts: normal appearance, no masses or tenderness Heart: regular rate and rhythm Abdomen: soft, non-tender; bowel sounds normal; no masses,  no organomegaly Extremities: extremities normal, atraumatic, no cyanosis or edema Skin: Skin color, texture, turgor normal. No rashes or lesions Lymph nodes: Cervical, supraclavicular, and axillary nodes normal. No abnormal inguinal nodes palpated Neurologic: Grossly normal   Pelvic: External genitalia:  no lesions              Urethra:  normal appearing urethra with no masses, tenderness or lesions              Bartholins and Skenes: normal                 Vagina: normal appearing vagina with normal color and discharge, no lesions              Cervix: no lesions  Pap taken: Yes.   Bimanual Exam:  Uterus:  normal size, contour, position, consistency, mobility, non-tender              Adnexa: normal adnexa and no mass, fullness, tenderness               Rectovaginal: Confirms               Anus:  normal sphincter tone, no lesions  Chaperone was present for exam.  A:  Well Woman with normal exam SA, no new partners in last year.  Had STD testing 1/18 Skyla due for removal 02/17/17.  Desires [name] as replacement  P:   Mammogram guidelines reviewed. pap smear and HR HPV obtained today.  Pt desires yearly pap smears.  Guidelines reviewed. Will return for Skyla removal and [name] replacement CBC and CMP obtained today Acyclovir 400mg  bid.  #60/12RF. return annually or prn

## 2017-02-04 NOTE — Progress Notes (Signed)
Scheduled patient while in office for IUD removal and insertion. Pre procedure instructions given.  Motrin instructions given. Motrin=Advil=Ibuprofen, 800 mg one hour before appointment. Eat a meal and hydrate well before appointment. Cytotec instructions given. Place 1 tablet PV night before the procedure. Place 1 tablet PV the morning of the procedure. Rx for Cytotec 200 mcg #2 0RF sent to pharmacy on file. Orders for IUD removal and replacement entered.

## 2017-02-05 LAB — COMPREHENSIVE METABOLIC PANEL
A/G RATIO: 1.4 (ref 1.2–2.2)
ALBUMIN: 4.3 g/dL (ref 3.5–5.5)
ALK PHOS: 77 IU/L (ref 39–117)
ALT: 15 IU/L (ref 0–32)
AST: 23 IU/L (ref 0–40)
BUN / CREAT RATIO: 15 (ref 9–23)
BUN: 10 mg/dL (ref 6–20)
Bilirubin Total: 0.6 mg/dL (ref 0.0–1.2)
CALCIUM: 9 mg/dL (ref 8.7–10.2)
CHLORIDE: 101 mmol/L (ref 96–106)
CO2: 22 mmol/L (ref 20–29)
Creatinine, Ser: 0.66 mg/dL (ref 0.57–1.00)
GFR calc non Af Amer: 118 mL/min/{1.73_m2} (ref >59)
GFR, EST AFRICAN AMERICAN: 136 mL/min/{1.73_m2} (ref >59)
Globulin, Total: 3.1 g/dL (ref 1.5–4.5)
Glucose: 96 mg/dL (ref 65–99)
POTASSIUM: 3.4 mmol/L — AB (ref 3.5–5.2)
SODIUM: 137 mmol/L (ref 134–144)
Total Protein: 7.4 g/dL (ref 6.0–8.5)

## 2017-02-05 LAB — CBC
HEMATOCRIT: 38.5 % (ref 34.0–46.6)
Hemoglobin: 12.8 g/dL (ref 11.1–15.9)
MCH: 30.8 pg (ref 26.6–33.0)
MCHC: 33.2 g/dL (ref 31.5–35.7)
MCV: 93 fL (ref 79–97)
PLATELETS: 223 10*3/uL (ref 150–379)
RBC: 4.16 x10E6/uL (ref 3.77–5.28)
RDW: 13.3 % (ref 12.3–15.4)
WBC: 10.6 10*3/uL (ref 3.4–10.8)

## 2017-02-06 LAB — CYTOLOGY - PAP
Diagnosis: NEGATIVE
HPV: NOT DETECTED

## 2017-02-14 ENCOUNTER — Ambulatory Visit (INDEPENDENT_AMBULATORY_CARE_PROVIDER_SITE_OTHER): Payer: BC Managed Care – PPO | Admitting: Obstetrics & Gynecology

## 2017-02-14 ENCOUNTER — Encounter: Payer: Self-pay | Admitting: Obstetrics & Gynecology

## 2017-02-14 VITALS — BP 100/64 | HR 66 | Ht 63.25 in | Wt 129.0 lb

## 2017-02-14 DIAGNOSIS — Z01812 Encounter for preprocedural laboratory examination: Secondary | ICD-10-CM

## 2017-02-14 DIAGNOSIS — Z30433 Encounter for removal and reinsertion of intrauterine contraceptive device: Secondary | ICD-10-CM | POA: Diagnosis not present

## 2017-02-14 LAB — POCT URINE PREGNANCY: Preg Test, Ur: NEGATIVE

## 2017-02-14 NOTE — Progress Notes (Signed)
32 y.o. G0P0000 Single Hispanic female presents for removal of Skyla IUD and re-insertion of IUD.  She is planning on using [name] IUD.  Pt has been counseled about alternative forms, condoms, and natural family planning.  She feels IUD is the better option for her.  Pt has also been counseled about risks and benefits as well as complications.  Consent is obtained today.  All questions answered prior to start of procedure.   Recent STD testing:  1/18.  No partner change since before that date. LMP:  Patient's last menstrual period was 02/11/2017 (exact date).  Patient Active Problem List   Diagnosis Date Noted  . IUD (intrauterine device) in place 01/12/2015   Past Medical History:  Diagnosis Date  . HSV-2 (herpes simplex virus 2) infection 2014   Current Outpatient Medications on File Prior to Visit  Medication Sig Dispense Refill  . acyclovir (ZOVIRAX) 400 MG tablet Take 1 tablet (400 mg total) by mouth 2 (two) times daily. 60 tablet 12  . desonide (DESOWEN) 0.05 % ointment Apply 1 application topically 2 (two) times a week.  1  . fluticasone (FLONASE) 50 MCG/ACT nasal spray Place 2 sprays into both nostrils daily.   1  . Levonorgestrel (SKYLA) 13.5 MG IUD by Intrauterine route.    . misoprostol (CYTOTEC) 200 MCG tablet Place 1 tablet PV night before the procedure. Place 1 tablet PV the morning of the procedure. 2 tablet 0  . montelukast (SINGULAIR) 10 MG tablet Take 10 mg by mouth at bedtime.   1   No current facility-administered medications on file prior to visit.    Patient has no known allergies.  ROS Vitals:   02/14/17 1602  BP: 100/64  Pulse: 66  Weight: 129 lb (58.5 kg)  Height: 5' 3.25" (1.607 m)    Gen:  WNWF healthy female NAD Abdomen: soft, non-tender Groin:  no inguinal nodes palpated  Pelvic exam: Vulva:  normal female genitalia Vagina:  normal vagina Cervix:  Non-tender, Negative CMT, no lesions or redness. Uterus:  normal shape, position and consistency    Procedure:  Speculum reinserted.  Cervix visualized and cleansed with Betadine x 3.  Paracervical block was not placed.  Single toothed tenaculum applied to anterior lip of cervix without difficulty.  IUD string noted and grasped with ringed forcep.  With one pull, IUD removed easily.  Pt has some mild cramping but tolerated this well.  Then uterus sounded to 6.5cm.  Lot number: [id number].  Expiration:  01/2019.  IUD package was opened.  IUD and introducer passed to fundus and then withdrawn slightly before IUD was passed into endometrial cavity.  Introducer removed.  Strings cut to 2cm.  Tenaculum removed from cervix.  Minimal bleeding noted.  Pt tolerated the procedure well.  All instruments removed from vagina.  A: Removal of Skyle IUD and reinsertion of [name] IUD Contraception desires  P:  Return for recheck 6-8 weeks Pt aware to call for any concerns Pt aware removal due no later than 02/14/2022.  IUD card given to pt.

## 2017-04-11 ENCOUNTER — Ambulatory Visit: Payer: BC Managed Care – PPO | Admitting: Obstetrics & Gynecology

## 2017-04-14 ENCOUNTER — Telehealth: Payer: Self-pay | Admitting: Obstetrics & Gynecology

## 2017-04-14 NOTE — Telephone Encounter (Signed)
Spoke with patient. Request RX for yeast. States she has hx of recurrent "yeast" during middle of menses. Reports vaginal itching, odor and yellow d/c. LMP 04/16/17.   Last diflucan RX 01/29/16, neg wet prep.  Denies any other symptoms or STD concerns.   Advised OV recommended for further evaluation. Patient declined office visits offered, requesting OV after 4pm. Patient states she is going to check with her PCP in [place] closer to home, will return call to schedule OV of still needed.    Routing to provider for final review. Patient is agreeable to disposition. Will close encounter.

## 2017-04-14 NOTE — Telephone Encounter (Signed)
Patient called and said she has symptoms of a yeast infection and that she often gets these symptoms around her menstrual cycle. She'd like something called into the pharmacy on file, if possible. Patient declined an appointment.

## 2017-04-14 NOTE — Telephone Encounter (Signed)
Left message to call Nayda Riesen at [phone].  

## 2017-04-30 ENCOUNTER — Other Ambulatory Visit: Payer: Self-pay

## 2017-04-30 ENCOUNTER — Ambulatory Visit (INDEPENDENT_AMBULATORY_CARE_PROVIDER_SITE_OTHER): Payer: BC Managed Care – PPO | Admitting: Obstetrics & Gynecology

## 2017-04-30 ENCOUNTER — Encounter: Payer: Self-pay | Admitting: Obstetrics & Gynecology

## 2017-04-30 VITALS — BP 102/62 | HR 68 | Resp 16 | Ht 63.25 in | Wt 122.0 lb

## 2017-04-30 DIAGNOSIS — Z30431 Encounter for routine checking of intrauterine contraceptive device: Secondary | ICD-10-CM

## 2017-04-30 NOTE — Progress Notes (Signed)
GYNECOLOGY  VISIT  CC:   Recheck after IUD plaement  HPI: 32 y.o. G0P0000 Single Hispanic female here for IUD check.  Did well after IUD removal/replacement on 02/14/17.  Has had two cycles.  First one was 3 days and flow was heavier.  The second cycle was only a day and a halfl and was much light.  Only needed a light pad for this.  Has had very minimal spotting.  Denies pelvic pain or pain with intercourse.  Has no abnormal vaginal discharge.  Really happy with choice.  GYNECOLOGIC HISTORY: Patient's last menstrual period was 04/12/2017 (exact date). Contraception: [name] placed 02/14/17 Menopausal hormone therapy: none  Patient Active Problem List   Diagnosis Date Noted  . IUD (intrauterine device) in place 01/12/2015    Past Medical History:  Diagnosis Date  . HSV-2 (herpes simplex virus 2) infection 2014    Past Surgical History:  Procedure Laterality Date  . CHOLECYSTECTOMY  2005  . INTRAUTERINE DEVICE INSERTION     02-14-17 removal of skyla & reinsertion of kyleena    MEDS:   Current Outpatient Medications on File Prior to Visit  Medication Sig Dispense Refill  . acyclovir (ZOVIRAX) 400 MG tablet Take 1 tablet (400 mg total) by mouth 2 (two) times daily. 60 tablet 12  . desonide (DESOWEN) 0.05 % ointment Apply 1 application topically 2 (two) times a week.  1  . fluticasone (FLONASE) 50 MCG/ACT nasal spray Place 2 sprays into both nostrils daily.   1  . Levonorgestrel ([name]) 19.5 MG IUD by Intrauterine route.    . montelukast (SINGULAIR) 10 MG tablet Take 10 mg by mouth at bedtime.   1   No current facility-administered medications on file prior to visit.     ALLERGIES: Patient has no known allergies.  Family History  Problem Relation Age of Onset  . Osteoarthritis Mother   . Osteoarthritis Maternal Grandfather   . Diabetes Father 3855       type 2  . Thyroid cancer Maternal Aunt 4932    SH:  Single, non smoker  Review of Systems  All other systems reviewed and  are negative.   PHYSICAL EXAMINATION:    BP 102/62   Pulse 68   Resp 16   Ht 5' 3.25" (1.607 m)   Wt 122 lb (55.3 kg)   LMP 04/12/2017 (Exact Date)   BMI 21.44 kg/m     General appearance: alert, cooperative and appears stated age Lymph:  No inguinal LAD  Pelvic: External genitalia:  no lesions              Urethra:  normal appearing urethra with no masses, tenderness or lesions              Bartholins and Skenes: normal                 Vagina: normal appearing vagina with normal color and discharge, no lesions              Cervix: no lesions and 2cm IUD string noted              Bimanual Exam:  Uterus:  normal size, contour, position, consistency, mobility, non-tender              Adnexa: no mass, fullness, tenderness   Chaperone was present for exam.  Assessment: S/p [name] placement, doing well, with improved cycles  Plan: Advised to call with any questions/concerns.  O/w pt will return for AEX as  scheduled

## 2017-05-09 ENCOUNTER — Encounter: Payer: Self-pay | Admitting: Certified Nurse Midwife

## 2017-05-09 ENCOUNTER — Ambulatory Visit: Payer: BC Managed Care – PPO | Admitting: Certified Nurse Midwife

## 2017-05-09 ENCOUNTER — Other Ambulatory Visit: Payer: Self-pay

## 2017-05-09 VITALS — BP 96/60 | HR 68 | Resp 16 | Ht 63.25 in | Wt 124.0 lb

## 2017-05-09 DIAGNOSIS — R350 Frequency of micturition: Secondary | ICD-10-CM

## 2017-05-09 DIAGNOSIS — N898 Other specified noninflammatory disorders of vagina: Secondary | ICD-10-CM

## 2017-05-09 HISTORY — PX: CHALAZION EXCISION: SHX213

## 2017-05-09 LAB — POCT URINALYSIS DIPSTICK
Bilirubin, UA: NEGATIVE
Blood, UA: NEGATIVE
Glucose, UA: NEGATIVE
Ketones, UA: NEGATIVE
LEUKOCYTES UA: NEGATIVE
NITRITE UA: NEGATIVE
PH UA: 5 (ref 5.0–8.0)
PROTEIN UA: NEGATIVE
Urobilinogen, UA: NEGATIVE E.U./dL — AB

## 2017-05-09 NOTE — Progress Notes (Signed)
32 y.o. Single Hispanic female G0P0000 here with complaint of vaginal symptoms of itching, burning, and increase discharge. Describes discharge  , but has not noted today. Treated with 7 day Monistat last pm with some relief... Onset of symptoms 3-4 days ago. Denies new personal products or vaginal dryness. No STD concerns. Urinary symptoms just urgency only. Contraception is [place] IUD. No issues with IUD. No other health issues today.  ROS Pertinent to HPI.  O:Healthy female WDWN Affect: normal, orientation x 3  poct urine-neg Exam:Skin warm and dry Abdomen:soft non tender, negative suprapubic Lymph nodes: no enlargement or tenderness Pelvic exam: External genital: normal female BUS: negative Vagina: Monistat cream large amount noted in vagina, no odor, Affirm taken Cervix: normal, non tender, no CMT, IUD string noted in cervical os Uterus: normal, non tender Adnexa:normal, non tender, no masses or fullness noted   A:Normal pelvic exam Contraception [name] IUD R/O vaginal infection Currently using Monistat 7 with some relief  P:Discussed findings of normal pelvic and etiology. Discussed Aveeno or baking soda sitz bath for comfort. Avoid moist clothes for extended period of time. If working out in gym clothes or swim suits for long periods of time change underwear or bottoms of swimsuit if possible. Coconut Oil use for skin protection prior to activity can be used to external skin for protection or dryness. Patient instructed to continue Monistat 7 until results in. This should help with symptoms also. Questions addressed. Lab; Affirm  Rv prn

## 2017-05-09 NOTE — Patient Instructions (Signed)

## 2017-05-10 LAB — VAGINITIS/VAGINOSIS, DNA PROBE
CANDIDA SPECIES: NEGATIVE
Gardnerella vaginalis: NEGATIVE
Trichomonas vaginosis: NEGATIVE

## 2018-02-16 ENCOUNTER — Encounter: Payer: Self-pay | Admitting: Obstetrics & Gynecology

## 2018-03-01 ENCOUNTER — Other Ambulatory Visit: Payer: Self-pay | Admitting: Obstetrics & Gynecology

## 2018-03-02 NOTE — Telephone Encounter (Signed)
Medication refill request: Acyclovir Last AEX:  02/04/17 SM Next AEX: 04/10/18  Last MMG (if hormonal medication request): n/a Refill authorized: 02/04/17 #60 w/12 refills; Order pended for #60 w/1 refill if authorized

## 2018-04-03 ENCOUNTER — Telehealth: Payer: Self-pay | Admitting: Obstetrics & Gynecology

## 2018-04-03 NOTE — Telephone Encounter (Signed)
Left patient a message to call back to reschedule a future appointment that was cancelled by the provider. °

## 2018-04-10 ENCOUNTER — Ambulatory Visit: Payer: BC Managed Care – PPO | Admitting: Obstetrics & Gynecology

## 2018-07-17 ENCOUNTER — Other Ambulatory Visit: Payer: Self-pay | Admitting: Obstetrics & Gynecology

## 2018-07-20 NOTE — Telephone Encounter (Signed)
Medication refill request: Zovirax  Last AEX:  02/04/17 Next AEX: Left message for patient to call to schedule AEX  Last MMG (if hormonal medication request): NA Refill authorized:# 60 with 0 RF

## 2018-07-20 NOTE — Telephone Encounter (Signed)
Refill was completed.

## 2018-12-14 ENCOUNTER — Other Ambulatory Visit: Payer: Self-pay | Admitting: Obstetrics & Gynecology

## 2018-12-14 NOTE — Telephone Encounter (Signed)
Medication refill request: Zovirax 400 Last AEX:  02/04/17 Next AEX: none scheduled  Last MMG (if hormonal medication request): NA Refill authorized: #30

## 2018-12-15 NOTE — Telephone Encounter (Signed)
Pt needs AEX.  Please call.  Rx for #30/1RF completed.

## 2019-04-02 ENCOUNTER — Encounter: Payer: Self-pay | Admitting: Certified Nurse Midwife

## 2019-05-19 ENCOUNTER — Other Ambulatory Visit: Payer: Self-pay | Admitting: Obstetrics & Gynecology

## 2019-05-20 NOTE — Telephone Encounter (Signed)
Rx declined.  Pt has not been here in >2 year.  Needs OV.

## 2019-05-20 NOTE — Telephone Encounter (Signed)
Medication refill request: Acyclovir  Last AEX:  02-04-17 SM  Next AEX: message left for patient to call and schedule aex Last MMG (if hormonal medication request): n/a Refill authorized: Today, please advise.   Medication pended for #60, 0RF. Please refill if appropriate.

## 2019-05-25 ENCOUNTER — Other Ambulatory Visit: Payer: Self-pay | Admitting: Obstetrics & Gynecology

## 2019-05-25 MED ORDER — ACYCLOVIR 400 MG PO TABS: 400.0000 mg | ORAL_TABLET | Freq: Two times a day (BID) | ORAL | 0 refills | Status: DC

## 2019-05-25 NOTE — Telephone Encounter (Signed)
Patient's refill request was denied because she is overdue for her aex. Patient scheduled 06/18/19 with Dr.Miller. Patient declined an earlier appointment due to having her aex with her PCP last year. See refill request.

## 2019-05-25 NOTE — Telephone Encounter (Signed)
Medication refill request: Acyclovir Last AEX:  02/04/17 SM Next AEX: 06/18/19 Last MMG (if hormonal medication request): n/a Refill authorized: Please advise; patient has now scheduled AEX appointment. Order pended #60 w/0 refills if authorized

## 2019-06-18 ENCOUNTER — Ambulatory Visit: Payer: BC Managed Care – PPO | Admitting: Obstetrics & Gynecology

## 2019-06-25 NOTE — Progress Notes (Signed)
34 y.o. G0P0000 Single Other or two or more races female here for annual exam.  Reports she went to her PCP last year in June and had a Pap smear done then.  Has IUD for contraception.  Does not typically bleed with her IUD.  Desires STD testing today.  Has scheduled breast augmentation with Dr. Harlow Mares in July.    No LMP recorded. (Menstrual status: IUD).          Sexually active: Yes.    The current method of family planning is IUD.   [name] placed 02-14-17 Exercising: Yes.    strength training Smoker:  no  Health Maintenance: Pap:  01-29-16 neg, 02-04-17 neg HPV HR neg, patient believes she had abnormal last year at pcp History of abnormal Pap:  yes MMG:  none Colonoscopy:  none BMD:   none TDaP:  2016 Screening Labs: done with Dr. Rica Records   reports that she has never smoked. She has never used smokeless tobacco. She reports current alcohol use. She reports that she does not use drugs.  Past Medical History:  Diagnosis Date  . Abnormal Pap smear of cervix   . HSV-2 (herpes simplex virus 2) infection 2014    Past Surgical History:  Procedure Laterality Date  . CHALAZION EXCISION  05/09/2017  . CHOLECYSTECTOMY  2005  . INTRAUTERINE DEVICE INSERTION     02-14-17 removal of skyla & reinsertion of kyleena    Current Outpatient Medications  Medication Sig Dispense Refill  . acyclovir (ZOVIRAX) 400 MG tablet Take 1 tablet (400 mg total) by mouth 2 (two) times daily. 60 tablet 0  . cyclobenzaprine (FLEXERIL) 5 MG tablet Take 5 mg by mouth at bedtime as needed.    . desonide (DESOWEN) 0.05 % ointment Apply 1 application topically 2 (two) times a week.  1  . fluticasone (FLONASE) 50 MCG/ACT nasal spray Place 2 sprays into both nostrils daily.   1  . fluticasone furoate-vilanterol (BREO ELLIPTA) 100-25 MCG/INH AEPB INHALE 1 PUFF BY MOUTH EVERY DAY AS DIRECTED    . Levonorgestrel ([name]) 19.5 MG IUD by Intrauterine route.    . loratadine (CLARITIN) 10 MG tablet Take by mouth.    .  montelukast (SINGULAIR) 10 MG tablet Take 10 mg by mouth at bedtime.   1  . Vitamin D, Ergocalciferol, (DRISDOL) 1.25 MG (50000 UNIT) CAPS capsule Take 50,000 Units by mouth once a week.     No current facility-administered medications for this visit.    Family History  Problem Relation Age of Onset  . Osteoarthritis Mother   . Osteoarthritis Maternal Grandfather   . Diabetes Father 71       type 2  . Thyroid cancer Maternal Aunt 32    Review of Systems  Constitutional: Negative.   HENT: Negative.   Eyes: Negative.   Respiratory: Negative.   Cardiovascular: Negative.   Gastrointestinal: Negative.   Endocrine: Negative.   Genitourinary: Negative.   Musculoskeletal: Negative.   Skin: Negative.   Allergic/Immunologic: Negative.   Neurological: Negative.   Hematological: Negative.   Psychiatric/Behavioral: Negative.     Exam:   BP 110/68   Pulse 68   Temp (!) 97.5 F (36.4 C) (Skin)   Resp 16   Ht 5' 3.75" (1.619 m)   Wt 139 lb (63 kg)   BMI 24.05 kg/m   Height: 5' 3.75" (161.9 cm)  General appearance: alert, cooperative and appears stated age Head: Normocephalic, without obvious abnormality, atraumatic Neck: no adenopathy, supple, symmetrical, trachea midline  and thyroid normal to inspection and palpation Lungs: clear to auscultation bilaterally Breasts: normal appearance, no masses or tenderness Heart: regular rate and rhythm Abdomen: soft, non-tender; bowel sounds normal; no masses,  no organomegaly Extremities: extremities normal, atraumatic, no cyanosis or edema Skin: Skin color, texture, turgor normal. No rashes or lesions Lymph nodes: Cervical, supraclavicular, and axillary nodes normal. No abnormal inguinal nodes palpated Neurologic: Grossly normal   Pelvic: External genitalia:  no lesions              Urethra:  normal appearing urethra with no masses, tenderness or lesions              Bartholins and Skenes: normal                 Vagina: normal  appearing vagina with normal color and discharge, no lesions              Cervix: no lesions              Pap taken: Yes.  (will be held until we have records from prior pap smear) Bimanual Exam:  Uterus:  normal size, contour, position, consistency, mobility, non-tender              Adnexa: normal adnexa and no mass, fullness, tenderness               Rectovaginal: Confirms               Anus:  normal sphincter tone, no lesions  Chaperone, Zenovia Jordan, CMA, was present for exam.  A:  Well Woman with normal exam [name] IUD placed 2019 Considering breast implant STD testing requested Vit D deficiency HSV 2  P:   Mammogram guidelines reviewed pap smear obtained and held today.  Records signed for release today HIV, RPR, Hep B and C obtained today GC/Chl, trich obtained today Screening blood work obtained with Dr. Mathis Bud Acyclovir 400mg  BID.  #60/12RF Return annually or prn

## 2019-06-28 ENCOUNTER — Other Ambulatory Visit: Payer: Self-pay

## 2019-06-29 ENCOUNTER — Encounter: Payer: Self-pay | Admitting: Obstetrics & Gynecology

## 2019-06-29 ENCOUNTER — Ambulatory Visit: Payer: BC Managed Care – PPO | Admitting: Obstetrics & Gynecology

## 2019-06-29 VITALS — BP 110/68 | HR 74 | Temp 97.5°F | Resp 16 | Ht 63.75 in | Wt 139.0 lb

## 2019-06-29 DIAGNOSIS — B009 Herpesviral infection, unspecified: Secondary | ICD-10-CM | POA: Diagnosis not present

## 2019-06-29 DIAGNOSIS — Z113 Encounter for screening for infections with a predominantly sexual mode of transmission: Secondary | ICD-10-CM | POA: Diagnosis not present

## 2019-06-29 DIAGNOSIS — Z01419 Encounter for gynecological examination (general) (routine) without abnormal findings: Secondary | ICD-10-CM

## 2019-06-29 MED ORDER — ACYCLOVIR 400 MG PO TABS: 400.0000 mg | ORAL_TABLET | Freq: Two times a day (BID) | ORAL | 12 refills | Status: DC

## 2019-06-30 LAB — HEPATITIS C ANTIBODY: Hep C Virus Ab: <0.1 s/co ratio (ref 0.0–0.9)

## 2019-06-30 LAB — HEP, RPR, HIV PANEL
HIV Screen 4th Generation wRfx: NONREACTIVE
Hepatitis B Surface Ag: NEGATIVE
RPR Ser Ql: NONREACTIVE

## 2019-07-01 ENCOUNTER — Other Ambulatory Visit (HOSPITAL_COMMUNITY)
Admission: RE | Admit: 2019-07-01 | Discharge: 2019-07-01 | Disposition: A | Discharge status: Home / Self Care | Payer: BC Managed Care – PPO | Source: Ambulatory Visit | Attending: Obstetrics & Gynecology | Admitting: Obstetrics & Gynecology

## 2019-07-01 ENCOUNTER — Other Ambulatory Visit: Payer: Self-pay | Admitting: Obstetrics & Gynecology

## 2019-07-01 ENCOUNTER — Telehealth: Payer: Self-pay

## 2019-07-01 DIAGNOSIS — R87610 Atypical squamous cells of undetermined significance on cytologic smear of cervix (ASC-US): Secondary | ICD-10-CM | POA: Insufficient documentation

## 2019-07-01 LAB — CHLAMYDIA/GONOCOCCUS/TRICHOMONAS, NAA
Chlamydia by NAA: NEGATIVE
Gonococcus by NAA: NEGATIVE
Trich vag by NAA: NEGATIVE

## 2019-07-01 NOTE — Telephone Encounter (Signed)
Patient notified of results as written by provider 

## 2019-07-01 NOTE — Telephone Encounter (Signed)
Pap done at [facility] showed ASCUS. HPV HR was not done. The pap we collected at aex has been sent out to be run. Left message to let patient know.

## 2019-07-01 NOTE — Telephone Encounter (Signed)
Patient is returning call.  °

## 2019-07-02 ENCOUNTER — Encounter: Payer: Self-pay | Admitting: *Deleted

## 2019-07-06 LAB — CYTOLOGY - PAP
Comment: NEGATIVE
Diagnosis: UNDETERMINED — AB
High risk HPV: NEGATIVE

## 2019-07-12 ENCOUNTER — Telehealth: Payer: Self-pay | Admitting: *Deleted

## 2019-07-12 DIAGNOSIS — R87610 Atypical squamous cells of undetermined significance on cytologic smear of cervix (ASC-US): Secondary | ICD-10-CM

## 2019-07-12 NOTE — Telephone Encounter (Signed)
Patient returned a call to Jill.   

## 2019-07-12 NOTE — Telephone Encounter (Signed)
-----   Message from Jerene Bears, MD sent at 07/09/2019  6:06 AM EDT ----- Please let pt know her pap showed ASCUS findings again.  Her HR HPV was negative with this current testing.  Her prior pap results (not done with [place]) also showed ASCUS.  We were able to get the outside records.  There was no HR HPV testing done.  By the current guidelines, she needs a colposcopy.  Please schedule.    CC:  Ashley Dixon, CMA

## 2019-07-12 NOTE — Telephone Encounter (Signed)
Leda Min, RN  07/12/2019 11:45 AM EDT Back to Top    Left message to call Noreene Larsson, RN at [facility] [phone].

## 2019-07-12 NOTE — Telephone Encounter (Signed)
Spoke with patient. Advised of results as seen below per Dr. Hyacinth Meeker.  IUD for contraceptive. No menses w/ IUD. Brief explanation of colpo provided, questions answered. Colpo scheduled for 08/04/19 at 9am w/ Dr. Hyacinth Meeker. Order placed for precert. Advised to take Motrin 800 mg with food and water one hour before procedure. Patient verbalizes understanding and is agreeable.   Routing to [employer] for [employer].   Encounter closed.

## 2019-07-13 ENCOUNTER — Telehealth: Payer: Self-pay | Admitting: Obstetrics & Gynecology

## 2019-07-13 NOTE — Telephone Encounter (Signed)
Spoke with patient regarding benefits for recommended Colposcopy. Patient acknowledges understanding of information presented. Encounter closed.

## 2019-07-28 ENCOUNTER — Emergency Department (HOSPITAL_COMMUNITY)
Admission: EM | Admit: 2019-07-28 | Discharge: 2019-07-28 | Disposition: A | Discharge status: Home / Self Care | Payer: BC Managed Care – PPO | Attending: Emergency Medicine | Admitting: Emergency Medicine

## 2019-07-28 ENCOUNTER — Emergency Department (HOSPITAL_COMMUNITY): Payer: BC Managed Care – PPO

## 2019-07-28 DIAGNOSIS — R0789 Other chest pain: Secondary | ICD-10-CM | POA: Diagnosis not present

## 2019-07-28 DIAGNOSIS — R079 Chest pain, unspecified: Secondary | ICD-10-CM

## 2019-07-28 DIAGNOSIS — R0602 Shortness of breath: Secondary | ICD-10-CM | POA: Diagnosis not present

## 2019-07-28 LAB — BASIC METABOLIC PANEL
Anion gap: 10 (ref 5–15)
BUN: 5 mg/dL — ABNORMAL LOW (ref 6–20)
CO2: 27 mmol/L (ref 22–32)
Calcium: 9.2 mg/dL (ref 8.9–10.3)
Chloride: 102 mmol/L (ref 98–111)
Creatinine, Ser: 0.68 mg/dL (ref 0.44–1.00)
GFR calc Af Amer: >60 mL/min (ref >60)
GFR calc non Af Amer: >60 mL/min (ref >60)
Glucose, Bld: 117 mg/dL — ABNORMAL HIGH (ref 70–99)
Potassium: 4.1 mmol/L (ref 3.5–5.1)
Sodium: 139 mmol/L (ref 135–145)

## 2019-07-28 LAB — I-STAT BETA HCG BLOOD, ED (MC, WL, AP ONLY): I-stat hCG, quantitative: <5 m[IU]/mL (ref <5)

## 2019-07-28 LAB — TROPONIN I (HIGH SENSITIVITY): Troponin I (High Sensitivity): 4 ng/L (ref <18)

## 2019-07-28 LAB — CBC
HCT: 43.3 % (ref 36.0–46.0)
Hemoglobin: 14.2 g/dL (ref 12.0–15.0)
MCH: 32 pg (ref 26.0–34.0)
MCHC: 32.8 g/dL (ref 30.0–36.0)
MCV: 97.5 fL (ref 80.0–100.0)
Platelets: 203 10*3/uL (ref 150–400)
RBC: 4.44 MIL/uL (ref 3.87–5.11)
RDW: 12.6 % (ref 11.5–15.5)
WBC: 14.6 10*3/uL — ABNORMAL HIGH (ref 4.0–10.5)
nRBC: 0 % (ref 0.0–0.2)

## 2019-07-28 LAB — D-DIMER, QUANTITATIVE: D-Dimer, Quant: 1.6 ug/mL-FEU — ABNORMAL HIGH (ref 0.00–0.50)

## 2019-07-28 MED ORDER — IOHEXOL 350 MG/ML SOLN
80.0000 mL | Freq: Once | INTRAVENOUS | Status: AC | PRN
  Administered 2019-07-28: 80 mL via INTRAVENOUS

## 2019-07-28 MED ORDER — SODIUM CHLORIDE 0.9% FLUSH: 3.0000 mL | Freq: Once | INTRAVENOUS | Status: DC

## 2019-07-28 NOTE — Discharge Instructions (Signed)
Continue medicine prescribed to you by Dr. Odis Luster Please use incentive spirometer to help prevent pneumonia Return for any worsening symptoms (severe pain or shortness of breath)

## 2019-07-28 NOTE — ED Provider Notes (Signed)
[facility] EMERGENCY DEPARTMENT Provider Note   CSN: [id number] Arrival date & time: 07/28/19  1507     History Chief Complaint  Patient presents with  . Chest Pain    Ashley Dixon is a 34 y.o. female who presents with left sided chest pain. She had breast augmentation surgery yesterday by Dr. Odis Luster with [facility]. This morning it started to hurt to take a deep breath on the left side. It feels better when she holds the area.  She has been prescribed pain medicine, muscle relaxers, antibiotics.  She went back to her surgeon today and was given an incentive spirometer.  He was advised to come to the emergency department to rule out a blood clot.  She denies any lightheadedness, dizziness, swelling in the legs.  No prior history of blood clots  HPI     Past Medical History:  Diagnosis Date  . Abnormal Pap smear of cervix   . Allergies   . HSV-2 (herpes simplex virus 2) infection 2014    Patient Active Problem List   Diagnosis Date Noted  . IUD (intrauterine device) in place 01/12/2015  . HSV-2 (herpes simplex virus 2) infection 2014    Past Surgical History:  Procedure Laterality Date  . CHALAZION EXCISION  05/09/2017  . CHOLECYSTECTOMY  2005  . INTRAUTERINE DEVICE INSERTION     02-14-17 removal of skyla & reinsertion of kyleena     OB History    Gravida  0   Para  0   Term  0   Preterm  0   AB  0   Living  0     SAB  0   TAB  0   Ectopic  0   Multiple  0   Live Births  0           Family History  Problem Relation Age of Onset  . Osteoarthritis Mother   . Osteoarthritis Maternal Grandfather   . Diabetes Father 40       type 2  . Thyroid cancer Maternal Aunt 25    Social History   Tobacco Use  . Smoking status: Never Smoker  . Smokeless tobacco: Never Used  Vaping Use  . Vaping Use: Never used  Substance Use Topics  . Alcohol use: Yes    Comment: socially  . Drug use: No    Home Medications Prior to Admission  medications   Medication Sig Start Date End Date Taking? Authorizing Provider  acyclovir (ZOVIRAX) 400 MG tablet Take 1 tablet (400 mg total) by mouth 2 (two) times daily. 06/29/19   Jerene Bears, MD  cyclobenzaprine (FLEXERIL) 5 MG tablet Take 5 mg by mouth at bedtime as needed. 01/06/19   [provider]  desonide (DESOWEN) 0.05 % ointment Apply 1 application topically 2 (two) times a week. 02/11/17   [provider]  fluticasone (FLONASE) 50 MCG/ACT nasal spray Place 2 sprays into both nostrils daily.  03/29/14   [provider]  fluticasone furoate-vilanterol (BREO ELLIPTA) 100-25 MCG/INH AEPB INHALE 1 PUFF BY MOUTH EVERY DAY AS DIRECTED 03/16/19   [provider]  Levonorgestrel ([name]) 19.5 MG IUD by Intrauterine route.    [provider]  loratadine (CLARITIN) 10 MG tablet Take by mouth. 09/23/15   [provider]  montelukast (SINGULAIR) 10 MG tablet Take 10 mg by mouth at bedtime.  03/29/14   [provider]  Vitamin D, Ergocalciferol, (DRISDOL) 1.25 MG (50000 UNIT) CAPS capsule Take 50,000  Units by mouth once a week. 06/02/19   [provider]    Allergies    Patient has no known allergies.  Review of Systems   Review of Systems  Respiratory: Positive for shortness of breath. Negative for cough.   Cardiovascular: Positive for chest pain. Negative for leg swelling.  Neurological: Negative for syncope and light-headedness.  All other systems reviewed and are negative.   Physical Exam Updated Vital Signs BP 123/82 (BP Location: Right Arm)   Pulse 88   Temp 99.4 F (37.4 C) (Oral)   Resp 18   Ht 5\' 2"  (1.575 m)   Wt 63.5 kg   SpO2 100%   BMI 25.61 kg/m   Physical Exam Vitals and nursing note reviewed.  Constitutional:      General: She is not in acute distress.    Appearance: Normal appearance. She is well-developed.  HENT:     Head: Normocephalic and atraumatic.  Eyes:     General: No scleral  icterus.       Right eye: No discharge.        Left eye: No discharge.     Conjunctiva/sclera: Conjunctivae normal.     Pupils: Pupils are equal, round, and reactive to light.  Cardiovascular:     Rate and Rhythm: Normal rate and regular rhythm.  Pulmonary:     Effort: Pulmonary effort is normal. No respiratory distress.     Breath sounds: Normal breath sounds.  Abdominal:     General: There is no distension.  Musculoskeletal:     Cervical back: Normal range of motion.  Skin:    General: Skin is warm and dry.  Neurological:     Mental Status: She is alert and oriented to person, place, and time.  Psychiatric:        Behavior: Behavior normal.     ED Results / Procedures / Treatments   Labs (all labs ordered are listed, but only abnormal results are displayed) Labs Reviewed  BASIC METABOLIC PANEL - Abnormal; Notable for the following components:      Result Value   Glucose, Bld 117 (*)    BUN 5 (*)    All other components within normal limits  CBC - Abnormal; Notable for the following components:   WBC 14.6 (*)    All other components within normal limits  D-DIMER, QUANTITATIVE (NOT AT [facility]) - Abnormal; Notable for the following components:   D-Dimer, Quant 1.60 (*)    All other components within normal limits  I-STAT BETA HCG BLOOD, ED ([facility], [facility], [facility] ONLY)  TROPONIN I (HIGH SENSITIVITY)  TROPONIN I (HIGH SENSITIVITY)    EKG None  Radiology CT Angio Chest PE W/Cm &/Or Wo Cm  Result Date: 07/28/2019 CLINICAL DATA:  Left-sided chest pain radiating to back, shortness of breath EXAM: CT ANGIOGRAPHY CHEST WITH CONTRAST TECHNIQUE: Multidetector CT imaging of the chest was performed using the standard protocol during bolus administration of intravenous contrast. Multiplanar CT image reconstructions and MIPs were obtained to evaluate the vascular anatomy. CONTRAST:  4mL OMNIPAQUE IOHEXOL 350 MG/ML SOLN COMPARISON:  None. FINDINGS: Cardiovascular: This is a technically adequate  evaluation of the pulmonary vasculature. No filling defects or pulmonary emboli. The heart and great vessels are unremarkable without pericardial effusion. Normal caliber of the thoracic aorta. Mediastinum/Nodes: No enlarged mediastinal, hilar, or axillary lymph nodes. Thyroid gland, trachea, and esophagus demonstrate no significant findings. Lungs/Pleura: Hypoventilatory changes are seen within the lower lobes. There is a trace left pleural effusion. No  pneumothorax. Central airways are patent. Upper Abdomen: No acute abnormality. Musculoskeletal: There is gas in the soft tissues of the anterior chest wall, primarily within the pectoralis musculature surrounding the indwelling breast prostheses. Please correlate with any recent surgical intervention or cosmetic surgery. There are no acute or destructive bony lesions. Reconstructed images demonstrate no additional findings. Review of the MIP images confirms the above findings. IMPRESSION: 1. No evidence of pulmonary embolus. 2. Soft tissue gas within the pectoralis musculature surrounding indwelling bilateral breast prostheses. Please correlate with any recent surgical intervention or cosmetic surgery. 3. Minimal dependent lower lobe atelectasis and trace left pleural effusion. No acute airspace disease. Electronically Signed   By: Sharlet Salina M.D.   On: 07/28/2019 17:51    Procedures Procedures (including critical care time)  Medications Ordered in ED Medications  sodium chloride flush (NS) 0.9 % injection 3 mL (has no administration in time range)  iohexol (OMNIPAQUE) 350 MG/ML injection 80 mL (80 mLs Intravenous Contrast Given 07/28/19 1725)    ED Course  I have reviewed the triage vital signs and the nursing notes.  Pertinent labs & imaging results that were available during my care of the patient were reviewed by me and considered in my medical decision making (see chart for details).  34 year old female presents inspiratory chest pain after  breast augmentation surgery yesterday.  Her vital signs are normal here.  On exam she is alert and in no acute distress.  Pain over the left side of the chest which is worse with inspiration and better when she holds the area.  Labs were obtained in triage and show a leukocytosis which is likely reactive from surgery yesterday.  A D-dimer was also obtained and a CT angio of the chest was ordered.  CT shows gas in the pectoral muscle as well as mild atelectasis with a small pleural effusion.  Patient was given an incentive spirometer today and she was encouraged to use this as well as take her medicines for pain.  She is encouraged to follow-up with her surgeon and to return to the ER for any worsening symptoms.  MDM Rules/Calculators/A&P                           Final Clinical Impression(s) / ED Diagnoses Final diagnoses:  Left-sided chest pain    Rx / DC Orders ED Discharge Orders    None       Bethel Born, PA-C 07/28/19 2016    Mancel Bale, MD 07/28/19 2155

## 2019-07-28 NOTE — ED Triage Notes (Signed)
Patient sent by Dr Odis Luster for CP, r/o PE. Patient had breast augmentation yesterday.

## 2019-08-03 ENCOUNTER — Institutional Professional Consult (permissible substitution): Payer: BC Managed Care – PPO | Admitting: Plastic Surgery

## 2019-08-04 ENCOUNTER — Encounter: Payer: Self-pay | Admitting: Obstetrics & Gynecology

## 2019-08-04 ENCOUNTER — Other Ambulatory Visit (HOSPITAL_COMMUNITY)
Admission: RE | Admit: 2019-08-04 | Discharge: 2019-08-04 | Disposition: A | Discharge status: Home / Self Care | Payer: BC Managed Care – PPO | Source: Ambulatory Visit | Attending: Obstetrics & Gynecology | Admitting: Obstetrics & Gynecology

## 2019-08-04 ENCOUNTER — Ambulatory Visit (INDEPENDENT_AMBULATORY_CARE_PROVIDER_SITE_OTHER): Payer: BC Managed Care – PPO | Admitting: Obstetrics & Gynecology

## 2019-08-04 ENCOUNTER — Other Ambulatory Visit: Payer: Self-pay

## 2019-08-04 VITALS — BP 110/74 | HR 66 | Ht 62.0 in | Wt 132.7 lb

## 2019-08-04 DIAGNOSIS — R10819 Abdominal tenderness, unspecified site: Secondary | ICD-10-CM | POA: Diagnosis not present

## 2019-08-04 DIAGNOSIS — R87610 Atypical squamous cells of undetermined significance on cytologic smear of cervix (ASC-US): Secondary | ICD-10-CM | POA: Diagnosis present

## 2019-08-04 DIAGNOSIS — R1032 Left lower quadrant pain: Secondary | ICD-10-CM

## 2019-08-04 DIAGNOSIS — N898 Other specified noninflammatory disorders of vagina: Secondary | ICD-10-CM | POA: Diagnosis not present

## 2019-08-04 LAB — POCT URINALYSIS DIPSTICK
Bilirubin, UA: NEGATIVE
Blood, UA: NEGATIVE
Glucose, UA: NEGATIVE
Ketones, UA: NEGATIVE
Nitrite, UA: NEGATIVE
Protein, UA: NEGATIVE
Urobilinogen, UA: NEGATIVE E.U./dL — AB
pH, UA: 6.5 (ref 5.0–8.0)

## 2019-08-04 MED ORDER — FLUCONAZOLE 150 MG PO TABS
150.0000 mg | ORAL_TABLET | Freq: Once | ORAL | 0 refills | Status: AC
Start: 2019-08-04 — End: 2019-08-04

## 2019-08-04 NOTE — Progress Notes (Signed)
GYNECOLOGY  VISIT  CC:   Colposcopy   HPI: 34 y.o. G0P0000 Single Other or two or more races female here for colposcopy due to ASCUS pap smear obtained at [facility] on 07/01/2019. She was seen by different provider in 2020 and had ASCUS pap then but no HR HPV testing was performed.  As a result, colposcopy was recommended.  Since seeing her in June, she has undergone breast augmentation surgery.  She was seen in ER day after procedure due to chest pain that ultimately turned out to be surgically related.  Please thus far with outcome of surgery.  Still wearing compression bra.  She has been on antibiotics post operatively.  Reports this morning, she noticed increased vaginal odor that was fishy smelling and some mild dysuria with mild LLQ pain.  Denies any blood in urine.  Has Mirena IUD for contraception.  Does not have much bleeding with this.   No LMP recorded. (Menstrual status: IUD).          Sexually active: Yes.    The current method of family planning is IUD.     Patient has been counseled about results and procedure.  Risks and benefits have bene reviewed including immediate and/or delayed bleeding, infection, cervical scaring from procedure, possibility of needing additional follow up as well as treatment.  rare risks of missing a lesion discussed as well.  All questions answered.  Pt ready to proceed.  BP 110/74   Pulse 66   Ht 5\' 2"  (1.575 m)   Wt 132 lb 11.2 oz (60.2 kg)   SpO2 98%   BMI 24.27 kg/m   Physical Exam Constitutional:      Appearance: Normal appearance.  Abdominal:     General: Abdomen is flat. There is no distension.     Palpations: Abdomen is soft.     Tenderness: There is no abdominal tenderness. There is no rebound.     Hernia: No hernia is present.  Genitourinary:    General: Normal vulva.     Labia:        Right: No rash, tenderness, lesion or injury.        Left: No rash, tenderness, lesion or injury.      Vagina: Vaginal discharge (white, clumpy) and  erythema present. No tenderness, bleeding or lesions.     Cervix: Normal.     Uterus: Normal.      Adnexa: Right adnexa normal and left adnexa normal.        Comments: IUD strings noted about 2cm in length Lymphadenopathy:     Lower Body: No right inguinal adenopathy. No left inguinal adenopathy.  Neurological:     General: No focal deficit present.     Mental Status: She is alert and oriented to person, place, and time.  Psychiatric:        Mood and Affect: Mood normal.     Speculum placed.  3% acetic acid applied to cervix for >45 seconds.  Cervix visualized with both 7.5X and 15X magnification.  Green filter also used.  Lugols solution was used.  Findings:  Decreased staining and AWE noted at 1-3 o'clock on cervix.  Ectocervical TZ was noted.  Biopsy:  2 o'clock.  ECC:  was performed.  Monsel's was needed.  Excellent hemostasis was present.  Pt tolerated procedure well and all instruments were removed.  Findings noted above on picture of cervix.  Chaperone,  , was present during procedure.  Assessment:  ASCUS pap with neg HR HPV  this year and ASCUS without any HR HPV testing obtained by another provider in 2020 Recent breast augmentation  Plan:  Pathology results will be called to patient and follow-up planned pending results. Vaginal discharge with odor Mild dysuria  Plan: Urine culture pending Affirm obtained today Cervical biopsy and ECC pending.  Results and recommendations will be called to pt Diflucan 150mg  po x 1, repeat 72 hours as needed.   In additional to colposcopy today, evaluation for vaginal discharge and odor as well as urinary symptoms also completed.

## 2019-08-06 ENCOUNTER — Other Ambulatory Visit: Payer: Self-pay | Admitting: Obstetrics & Gynecology

## 2019-08-06 ENCOUNTER — Other Ambulatory Visit: Payer: Self-pay | Admitting: *Deleted

## 2019-08-06 LAB — VAGINITIS/VAGINOSIS, DNA PROBE
Candida Species: POSITIVE — AB
Gardnerella vaginalis: POSITIVE — AB
Trichomonas vaginosis: NEGATIVE

## 2019-08-06 LAB — URINE CULTURE

## 2019-08-06 LAB — SURGICAL PATHOLOGY

## 2019-08-06 MED ORDER — NITROFURANTOIN MONOHYD MACRO 100 MG PO CAPS: 100.0000 mg | ORAL_CAPSULE | Freq: Two times a day (BID) | ORAL | 0 refills | Status: DC

## 2019-08-06 MED ORDER — SULFAMETHOXAZOLE-TRIMETHOPRIM 800-160 MG PO TABS: 1.0000 | ORAL_TABLET | Freq: Two times a day (BID) | ORAL | 0 refills | Status: AC

## 2019-08-06 MED ORDER — METRONIDAZOLE 0.75 % VA GEL
1.0000 | Freq: Every day | VAGINAL | 0 refills | Status: AC
Start: 2019-08-06 — End: 2019-08-11

## 2020-07-26 ENCOUNTER — Other Ambulatory Visit: Payer: Self-pay | Admitting: Obstetrics & Gynecology

## 2020-07-26 ENCOUNTER — Other Ambulatory Visit (HOSPITAL_BASED_OUTPATIENT_CLINIC_OR_DEPARTMENT_OTHER): Payer: Self-pay

## 2020-07-26 MED ORDER — ACYCLOVIR 400 MG PO TABS: 400.0000 mg | ORAL_TABLET | Freq: Two times a day (BID) | ORAL | 2 refills | Status: DC

## 2020-08-09 ENCOUNTER — Other Ambulatory Visit: Payer: Self-pay

## 2020-08-09 DIAGNOSIS — J45909 Unspecified asthma, uncomplicated: Secondary | ICD-10-CM | POA: Insufficient documentation

## 2020-08-09 DIAGNOSIS — G5600 Carpal tunnel syndrome, unspecified upper limb: Secondary | ICD-10-CM | POA: Insufficient documentation

## 2020-08-09 DIAGNOSIS — T7840XA Allergy, unspecified, initial encounter: Secondary | ICD-10-CM | POA: Insufficient documentation

## 2020-08-09 DIAGNOSIS — J309 Allergic rhinitis, unspecified: Secondary | ICD-10-CM | POA: Insufficient documentation

## 2020-08-09 DIAGNOSIS — Z30018 Encounter for initial prescription of other contraceptives: Secondary | ICD-10-CM | POA: Insufficient documentation

## 2020-08-09 DIAGNOSIS — B373 Candidiasis of vulva and vagina: Secondary | ICD-10-CM | POA: Insufficient documentation

## 2020-08-09 DIAGNOSIS — E559 Vitamin D deficiency, unspecified: Secondary | ICD-10-CM | POA: Insufficient documentation

## 2020-08-09 DIAGNOSIS — Z6824 Body mass index (BMI) 24.0-24.9, adult: Secondary | ICD-10-CM | POA: Insufficient documentation

## 2020-08-09 DIAGNOSIS — Z6825 Body mass index (BMI) 25.0-25.9, adult: Secondary | ICD-10-CM | POA: Insufficient documentation

## 2020-08-09 DIAGNOSIS — F419 Anxiety disorder, unspecified: Secondary | ICD-10-CM | POA: Insufficient documentation

## 2020-08-09 DIAGNOSIS — B3731 Acute candidiasis of vulva and vagina: Secondary | ICD-10-CM | POA: Insufficient documentation

## 2020-08-09 DIAGNOSIS — R0789 Other chest pain: Secondary | ICD-10-CM | POA: Insufficient documentation

## 2020-08-09 DIAGNOSIS — R87619 Unspecified abnormal cytological findings in specimens from cervix uteri: Secondary | ICD-10-CM | POA: Insufficient documentation

## 2020-08-09 DIAGNOSIS — G43909 Migraine, unspecified, not intractable, without status migrainosus: Secondary | ICD-10-CM | POA: Insufficient documentation

## 2020-08-09 HISTORY — DX: Other chest pain: R07.89

## 2020-08-15 ENCOUNTER — Ambulatory Visit: Payer: BC Managed Care – PPO

## 2020-08-25 ENCOUNTER — Ambulatory Visit: Payer: BC Managed Care – PPO | Admitting: Cardiology

## 2020-10-05 ENCOUNTER — Encounter (HOSPITAL_BASED_OUTPATIENT_CLINIC_OR_DEPARTMENT_OTHER): Payer: Self-pay | Admitting: Obstetrics & Gynecology

## 2020-10-05 ENCOUNTER — Other Ambulatory Visit: Payer: Self-pay

## 2020-10-05 ENCOUNTER — Ambulatory Visit (INDEPENDENT_AMBULATORY_CARE_PROVIDER_SITE_OTHER): Payer: BC Managed Care – PPO | Admitting: Obstetrics & Gynecology

## 2020-10-05 ENCOUNTER — Other Ambulatory Visit (HOSPITAL_COMMUNITY)
Admission: RE | Admit: 2020-10-05 | Discharge: 2020-10-05 | Disposition: A | Discharge status: Home / Self Care | Payer: BC Managed Care – PPO | Source: Ambulatory Visit | Attending: Obstetrics & Gynecology | Admitting: Obstetrics & Gynecology

## 2020-10-05 VITALS — BP 102/57 | HR 66 | Ht 62.0 in | Wt 139.8 lb

## 2020-10-05 DIAGNOSIS — Z01419 Encounter for gynecological examination (general) (routine) without abnormal findings: Secondary | ICD-10-CM | POA: Diagnosis not present

## 2020-10-05 DIAGNOSIS — Z23 Encounter for immunization: Secondary | ICD-10-CM | POA: Diagnosis not present

## 2020-10-05 DIAGNOSIS — Z124 Encounter for screening for malignant neoplasm of cervix: Secondary | ICD-10-CM

## 2020-10-05 DIAGNOSIS — R87610 Atypical squamous cells of undetermined significance on cytologic smear of cervix (ASC-US): Secondary | ICD-10-CM

## 2020-10-05 DIAGNOSIS — Z975 Presence of (intrauterine) contraceptive device: Secondary | ICD-10-CM

## 2020-10-05 DIAGNOSIS — B009 Herpesviral infection, unspecified: Secondary | ICD-10-CM | POA: Diagnosis not present

## 2020-10-05 NOTE — Progress Notes (Signed)
35 y.o. G0P0000 Single Other or two or more races female here for annual exam.  Had breast implants placed in 07/2019 with Dr. Odis Luster.  Very satisfied with decision.  Denies vaginal bleeding.  [name] (#2) placed in 2019.  Can spot on occasion.    Together with same partner for 17+years  No LMP recorded. (Menstrual status: IUD).          Sexually active: Yes.    The current method of family planning is IUD.    Exercising: No.  Smoker:  no  Health Maintenance: Pap:  07/01/2019 ASCUS History of abnormal Pap:  yes MMG:  guidelines reviewed Colonoscopy:  guidelines reviewed TDaP:  2016 Hep C testing: 2021 Screening Labs: Dr. Mathis Bud   reports that she has never smoked. She has never used smokeless tobacco. She reports current alcohol use. She reports that she does not use drugs.  Past Medical History:  Diagnosis Date   Abnormal Pap smear of cervix    Allergic rhinitis    Allergies    Anxiety disorder    BMI 24.0-24.9, adult    BMI 25.0-25.9,adult    CTS (carpal tunnel syndrome)    Hormonal contraceptive    HSV-2 (herpes simplex virus 2) infection 2014   Intermittent left-sided chest pain    IUD (intrauterine device) in place 01/12/2015   Sklya placed 2/16   Migraine    Reactive airway disease    Recurrent candidiasis of vagina    Vitamin D deficiency     Past Surgical History:  Procedure Laterality Date   CHALAZION EXCISION  05/09/2017   CHOLECYSTECTOMY  2005   INTRAUTERINE DEVICE INSERTION     02-14-17 removal of skyla & reinsertion of kyleena   PLACEMENT OF BREAST IMPLANTS Bilateral 07/2012    Current Outpatient Medications  Medication Sig Dispense Refill   acyclovir (ZOVIRAX) 400 MG tablet TAKE 1 TABLET(400 MG) BY MOUTH TWICE DAILY 60 tablet 12   albuterol (VENTOLIN HFA) 108 (90 Base) MCG/ACT inhaler Inhale 2 puffs into the lungs 3 (three) times daily as needed for wheezing or shortness of breath.     cyclobenzaprine (FLEXERIL) 5 MG tablet Take 5 mg by mouth at  bedtime as needed for muscle spasms.     desonide (DESOWEN) 0.05 % ointment Apply 1 application topically 2 (two) times a week.  1   fluticasone (FLONASE) 50 MCG/ACT nasal spray Place 2 sprays into both nostrils daily.   1   fluticasone furoate-vilanterol (BREO ELLIPTA) 100-25 MCG/INH AEPB INHALE 1 PUFF BY MOUTH EVERY DAY AS DIRECTED     levonorgestrel ([name]) 19.5 MG IUD 1 Intra Uterine Device by Intrauterine route once.     montelukast (SINGULAIR) 10 MG tablet Take 10 mg by mouth at bedtime.   1   vitamin B-12 (CYANOCOBALAMIN) 100 MCG tablet Take 100 mcg by mouth daily.     Vitamin D, Ergocalciferol, (DRISDOL) 1.25 MG (50000 UNIT) CAPS capsule Take 50,000 Units by mouth once a week.     doxycycline (MONODOX) 100 MG capsule Take 100 mg by mouth 2 (two) times daily.     No current facility-administered medications for this visit.    Family History  Problem Relation Age of Onset   Osteoarthritis Mother    Osteoarthritis Maternal Grandfather    Diabetes Father 18       type 2   Thyroid cancer Maternal Aunt 32    Review of Systems  All other systems reviewed and are negative.  Exam:   BP Marland Kitchen)  102/57 (BP Location: Right Arm, Patient Position: Sitting, Cuff Size: Small)   Pulse 66   Ht 5\' 2"  (1.575 m) Comment: reported  Wt 139 lb 12.8 oz (63.4 kg)   BMI 25.57 kg/m   Height: 5\' 2"  (157.5 cm) (reported)  General appearance: alert, cooperative and appears stated age Head: Normocephalic, without obvious abnormality, atraumatic Neck: no adenopathy, supple, symmetrical, trachea midline and thyroid normal to inspection and palpation Lungs: clear to auscultation bilaterally Breasts: normal appearance, no masses or tenderness Heart: regular rate and rhythm Abdomen: soft, non-tender; bowel sounds normal; no masses,  no organomegaly Extremities: extremities normal, atraumatic, no cyanosis or edema Skin: Skin color, texture, turgor normal. No rashes or lesions Lymph nodes: Cervical,  supraclavicular, and axillary nodes normal. No abnormal inguinal nodes palpated Neurologic: Grossly normal  Pelvic: External genitalia:  no lesions              Urethra:  normal appearing urethra with no masses, tenderness or lesions              Bartholins and Skenes: normal                 Vagina: normal appearing vagina with normal color and no discharge, no lesions              Cervix: no lesions              Pap taken: Yes.   Bimanual Exam:  Uterus:  normal size, contour, position, consistency, mobility, non-tender              Adnexa: normal adnexa and no mass, fullness, tenderness               Rectovaginal: Confirms               Anus:  normal sphincter tone, no lesions  Chaperone, , CMA, was present for exam.  Assessment/Plan: 1. Well woman exam with routine gynecological exam - Pap smear obtained today (h/o ASCUS pap smear) - Mammogram guidelines reviewed - Colonoscopy guidelines reviewed - lab work done done with PCP - Care Gabs reviewed  2. HSV-2 (herpes simplex virus 2) infection - does not need RF for acyclovir.  Done 07/2020.  3. IUD (intrauterine device) in place - [name] IUD placed 2019  4. Need for immunization against influenza - Flu Vaccine QUAD 35mo+IM (Fluarix, Fluzone & Alfiuria Quad PF)

## 2020-10-10 LAB — CYTOLOGY - PAP
Adequacy: ABSENT
Diagnosis: NEGATIVE

## 2021-02-05 ENCOUNTER — Ambulatory Visit (HOSPITAL_BASED_OUTPATIENT_CLINIC_OR_DEPARTMENT_OTHER): Payer: BC Managed Care – PPO | Admitting: Obstetrics & Gynecology

## 2021-02-05 ENCOUNTER — Encounter (HOSPITAL_BASED_OUTPATIENT_CLINIC_OR_DEPARTMENT_OTHER): Payer: Self-pay | Admitting: Obstetrics & Gynecology

## 2021-02-05 ENCOUNTER — Other Ambulatory Visit (HOSPITAL_COMMUNITY)
Admission: RE | Admit: 2021-02-05 | Discharge: 2021-02-05 | Disposition: A | Discharge status: Home / Self Care | Payer: BC Managed Care – PPO | Source: Ambulatory Visit | Attending: Obstetrics & Gynecology | Admitting: Obstetrics & Gynecology

## 2021-02-05 ENCOUNTER — Other Ambulatory Visit: Payer: Self-pay

## 2021-02-05 VITALS — BP 132/91 | HR 73 | Ht 63.0 in | Wt 148.6 lb

## 2021-02-05 DIAGNOSIS — Z1159 Encounter for screening for other viral diseases: Secondary | ICD-10-CM

## 2021-02-05 DIAGNOSIS — N898 Other specified noninflammatory disorders of vagina: Secondary | ICD-10-CM | POA: Insufficient documentation

## 2021-02-05 DIAGNOSIS — Z319 Encounter for procreative management, unspecified: Secondary | ICD-10-CM | POA: Diagnosis not present

## 2021-02-05 NOTE — Progress Notes (Signed)
GYNECOLOGY  VISIT  CC:   discuss desires for pregnancy, desires STD testing  HPI: 36 y.o. G0P0000 Single Other or two or more races female here for discussion of possible future pregnancy.  Pt and significant other has been discussing this.  Pt has IUD and is not ready for removal at this time.  She does not really bleed with IUD.    Pt desires STD testing today.  Denies symptoms.  PMed hx:  HSV 2, migraines, allergies PSurg Hx: breast implant placement, cholecystectomy Smoker: No  Spouse PMed Hx: negative Spouse PSurg hx:  negative Spouse smokes: No  D/w pt components of fertility evaluation including testing for ovulation, semen analysis testing, and evaluation of cavity of uterus and for tubal patency.  Speed of this evaluation and proceeding with fertility medication will depend on desires and pt and her spouse.  Also, due to age, feel should test for ovarian reserve as well.  All of this was written down for pt and she clearly understands the plan.  Questions invited and answered.  Specific instructions regarding each part of evaluation were given to patient.  Past Medical History:  Diagnosis Date   Abnormal Pap smear of cervix    Allergic rhinitis    Allergies    Anxiety disorder    BMI 24.0-24.9, adult    BMI 25.0-25.9,adult    CTS (carpal tunnel syndrome)    Hormonal contraceptive    HSV-2 (herpes simplex virus 2) infection 2014   Intermittent left-sided chest pain    IUD (intrauterine device) in place 01/12/2015   Sklya placed 2/16   Migraine    Reactive airway disease    Recurrent candidiasis of vagina    Vitamin D deficiency     Past Surgical History:  Procedure Laterality Date   CHALAZION EXCISION  05/09/2017   CHOLECYSTECTOMY  2005   INTRAUTERINE DEVICE INSERTION     02-14-17 removal of skyla & reinsertion of kyleena   PLACEMENT OF BREAST IMPLANTS Bilateral 07/2012    MEDS:   Current Outpatient Medications on File Prior to Visit  Medication Sig Dispense  Refill   acyclovir (ZOVIRAX) 400 MG tablet TAKE 1 TABLET(400 MG) BY MOUTH TWICE DAILY 60 tablet 12   albuterol (VENTOLIN HFA) 108 (90 Base) MCG/ACT inhaler Inhale 2 puffs into the lungs 3 (three) times daily as needed for wheezing or shortness of breath.     cyclobenzaprine (FLEXERIL) 5 MG tablet Take 5 mg by mouth at bedtime as needed for muscle spasms.     desonide (DESOWEN) 0.05 % ointment Apply 1 application topically 2 (two) times a week.  1   fluticasone (FLONASE) 50 MCG/ACT nasal spray Place 2 sprays into both nostrils daily.   1   fluticasone furoate-vilanterol (BREO ELLIPTA) 100-25 MCG/INH AEPB INHALE 1 PUFF BY MOUTH EVERY DAY AS DIRECTED     levonorgestrel ([name]) 19.5 MG IUD 1 Intra Uterine Device by Intrauterine route once.     montelukast (SINGULAIR) 10 MG tablet Take 10 mg by mouth at bedtime.   1   vitamin B-12 (CYANOCOBALAMIN) 100 MCG tablet Take 100 mcg by mouth daily.     Vitamin D, Ergocalciferol, (DRISDOL) 1.25 MG (50000 UNIT) CAPS capsule Take 50,000 Units by mouth once a week.     doxycycline (MONODOX) 100 MG capsule Take 100 mg by mouth 2 (two) times daily. (Patient not taking: Reported on 02/05/2021)     No current facility-administered medications on file prior to visit.    ALLERGIES: Patient has  no known allergies.  Family History  Problem Relation Age of Onset   Osteoarthritis Mother    Osteoarthritis Maternal Grandfather    Diabetes Father 62       type 2   Thyroid cancer Maternal Aunt 33    SH:  in relationship  Review of Systems  Constitutional: Negative.   Genitourinary: Negative.    PHYSICAL EXAMINATION:    BP (!) 132/91 (BP Location: Left Arm, Patient Position: Sitting, Cuff Size: Normal)    Pulse 73    Ht 5\' 3"  (1.6 m) Comment: reported   Wt 148 lb 9.6 oz (67.4 kg)    BMI 26.32 kg/m     General appearance: alert, cooperative and appears stated age Lymph:  no inguinal LAD noted  Pelvic: External genitalia:  no lesions              Urethra:   normal appearing urethra with no masses, tenderness or lesions              Bartholins and Skenes: normal                 Vagina: normal appearing vagina with normal color and discharge, no lesions              Cervix: no lesions              Bimanual Exam:  Uterus:  normal size, contour, position, consistency, mobility, non-tender              Adnexa: no mass, fullness, tenderness  Chaperone, , CMA, was present for exam.  Assessment/Plan: 1. Patient desires pregnancy - evaluation discussed - recommended starting PNV - ovulation testing recommended - semen analysis testing recommended.  Information given.  Order filled out. - Rubella screen  2. Screening examination for rubella - ancillary swab testing obtained for GC/Chl/trich  Total time with pt and documentation: 32 minutes

## 2021-02-05 NOTE — Patient Instructions (Signed)
Start a prenatal vitamin  Today we tested for baseline fertility and rubella status.  If rubella is non-immune, I'll recommend you get revaccinated.    Consider doing a semen analysis  Consider starting to test for ovulation  Later, we can take your Verdia Kuba IUD out and consider evaluating the fallopian tubes for patency.

## 2021-02-06 LAB — CERVICOVAGINAL ANCILLARY ONLY
Bacterial Vaginitis (gardnerella): NEGATIVE
Candida Glabrata: NEGATIVE
Candida Vaginitis: NEGATIVE
Chlamydia: NEGATIVE
Comment: NEGATIVE
Comment: NEGATIVE
Comment: NEGATIVE
Comment: NEGATIVE
Comment: NEGATIVE
Comment: NORMAL
Neisseria Gonorrhea: NEGATIVE
Trichomonas: NEGATIVE

## 2021-02-07 ENCOUNTER — Encounter (HOSPITAL_BASED_OUTPATIENT_CLINIC_OR_DEPARTMENT_OTHER): Payer: Self-pay | Admitting: Obstetrics & Gynecology

## 2021-02-10 LAB — RUBELLA SCREEN: Rubella Antibodies, IGG: 4.91 index (ref >0.99)

## 2021-02-10 LAB — ANTI MULLERIAN HORMONE: ANTI-MULLERIAN HORMONE (AMH): 7.57 ng/mL

## 2021-08-03 ENCOUNTER — Other Ambulatory Visit (HOSPITAL_BASED_OUTPATIENT_CLINIC_OR_DEPARTMENT_OTHER): Payer: Self-pay | Admitting: Obstetrics & Gynecology

## 2021-10-12 ENCOUNTER — Ambulatory Visit (HOSPITAL_BASED_OUTPATIENT_CLINIC_OR_DEPARTMENT_OTHER): Payer: BC Managed Care – PPO | Admitting: Obstetrics & Gynecology

## 2021-12-22 IMAGING — CT CT ANGIO CHEST
2 of 6 series · 17 of 46 positions shown · IV contrast (omnipaque)
Comparison: None.

CLINICAL DATA: Left-sided chest pain radiating to back, shortness
of breath

EXAM:
CT ANGIOGRAPHY CHEST WITH CONTRAST
TECHNIQUE: Multidetector CT imaging of the chest was performed using the
standard protocol during bolus administration of intravenous
contrast. Multiplanar CT image reconstructions and MIPs were
obtained to evaluate the vascular anatomy.
CONTRAST:  80mL OMNIPAQUE IOHEXOL 350 MG/ML SOLN

[Series 7: thins · axial · 0.71mm/px · z∈[+149,+369]mm · 14 of 346 slices shown]
[im 16/346  lung]
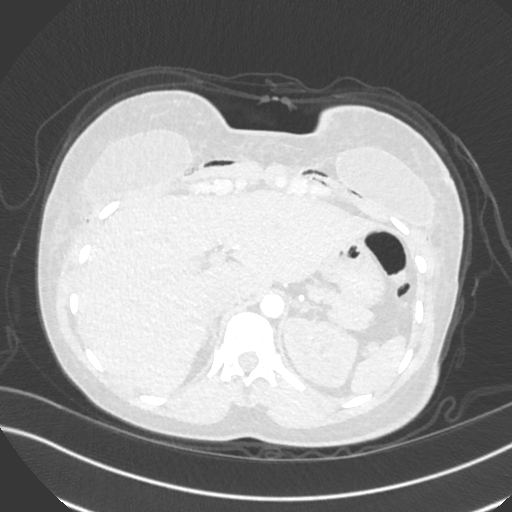
[im 46/346  soft-tissue]
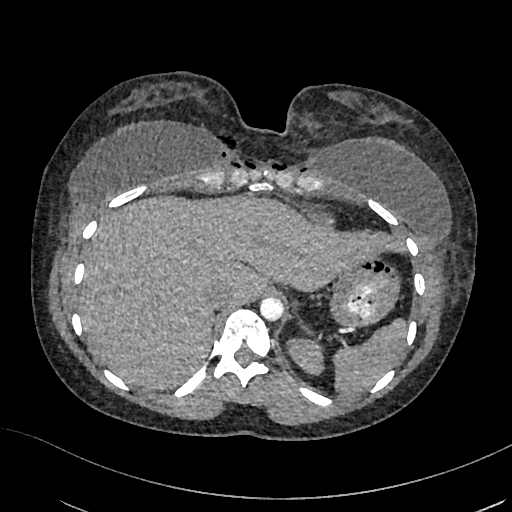
[im 61/346  lung]
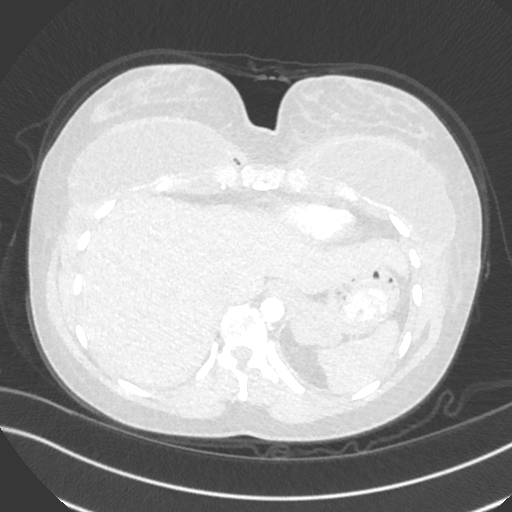
[im 91/346  soft-tissue]
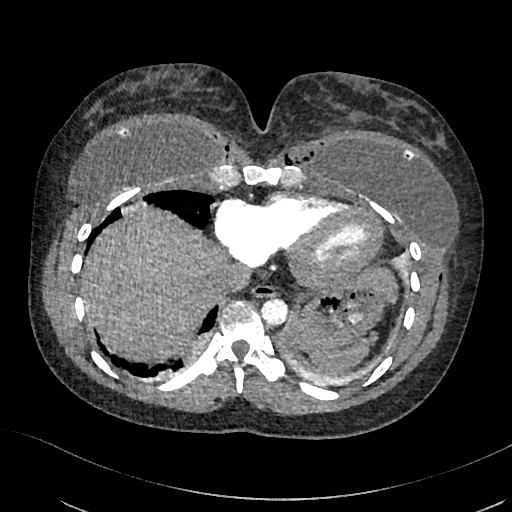
[im 121/346  lung]
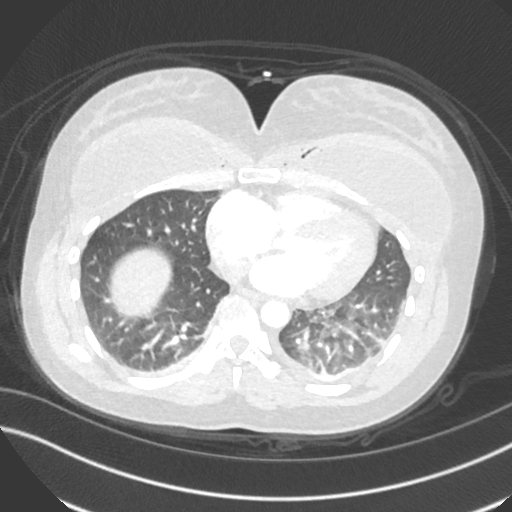
[im 136/346  soft-tissue]
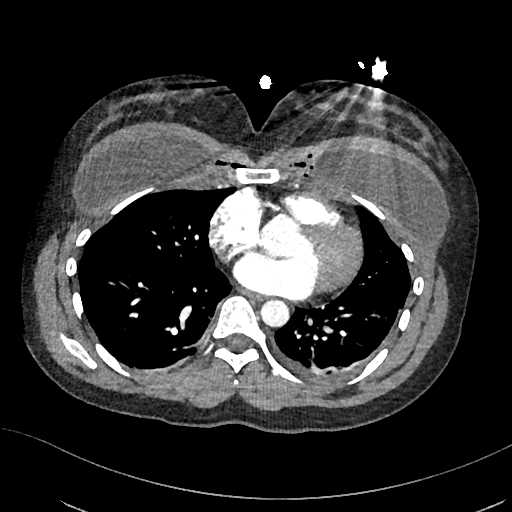
[im 166/346  lung]
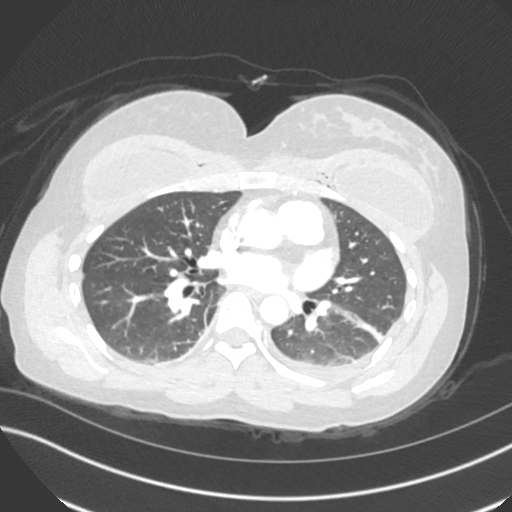
[im 181/346  soft-tissue]
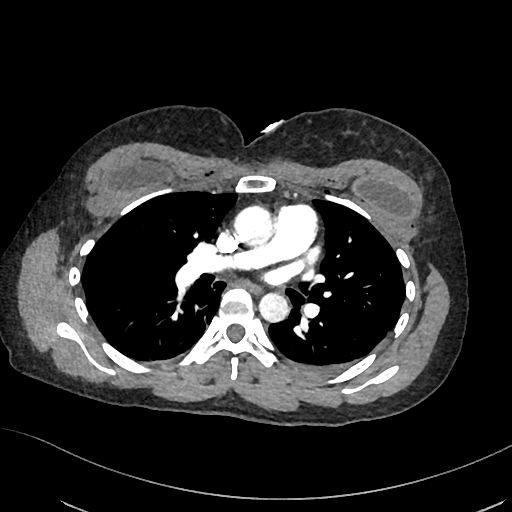
[im 211/346  lung]
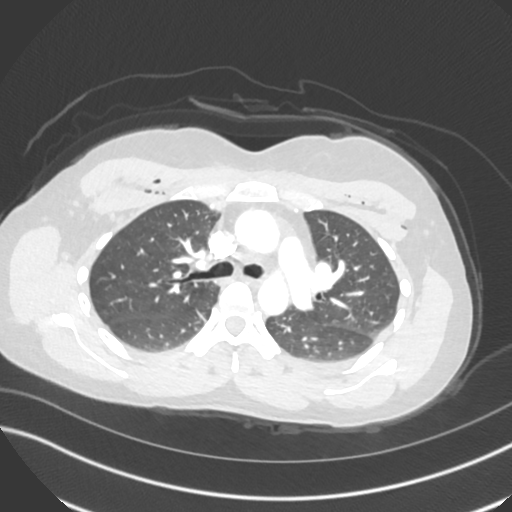
[im 226/346  soft-tissue]
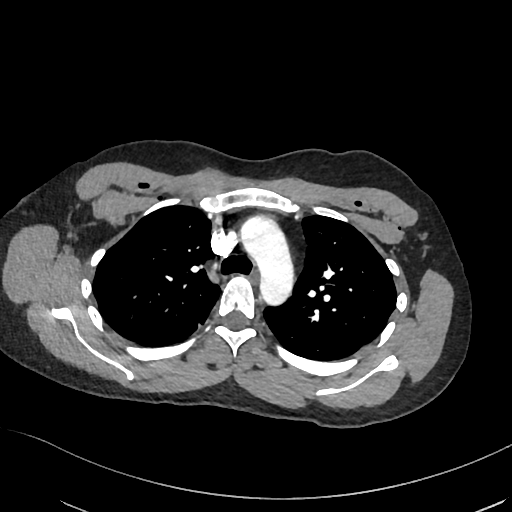
[im 256/346  lung]
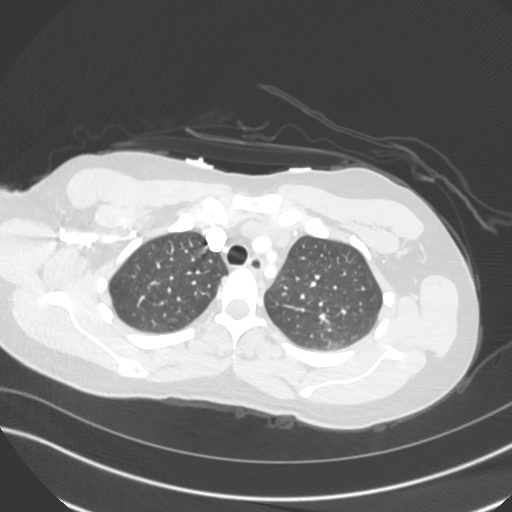
[im 286/346  soft-tissue]
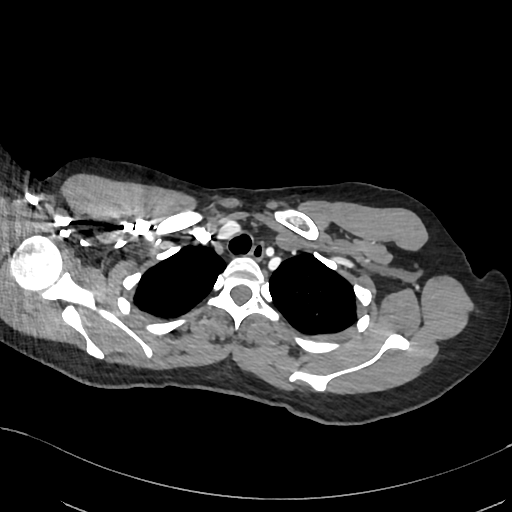
[im 301/346  lung]
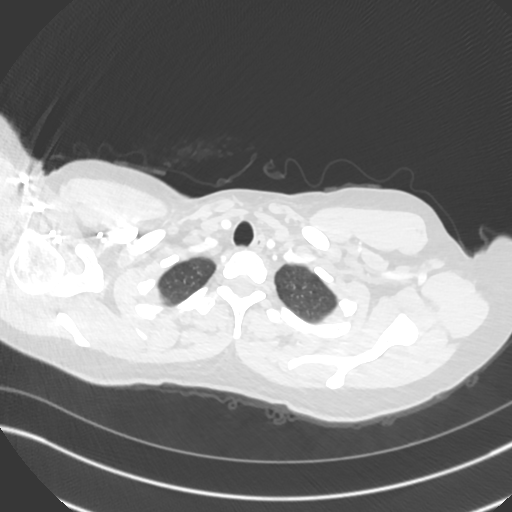
[im 331/346  soft-tissue]
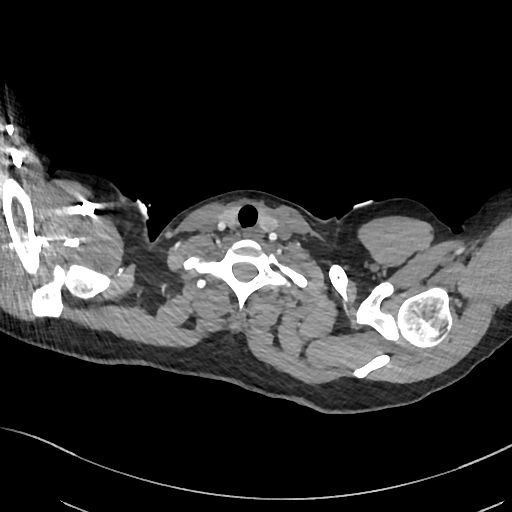

[Series 8: cor · coronal · 0.47mm/px · 3 of 137 slices shown]
[im 35/137  soft-tissue]
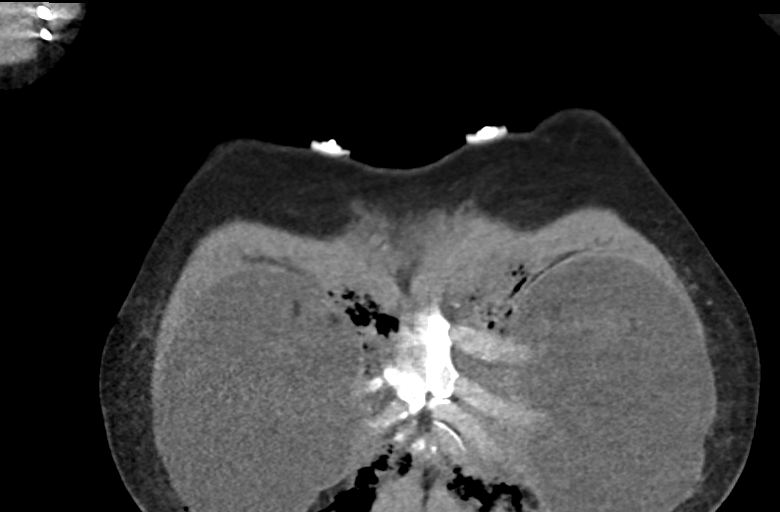
[im 69/137  soft-tissue]
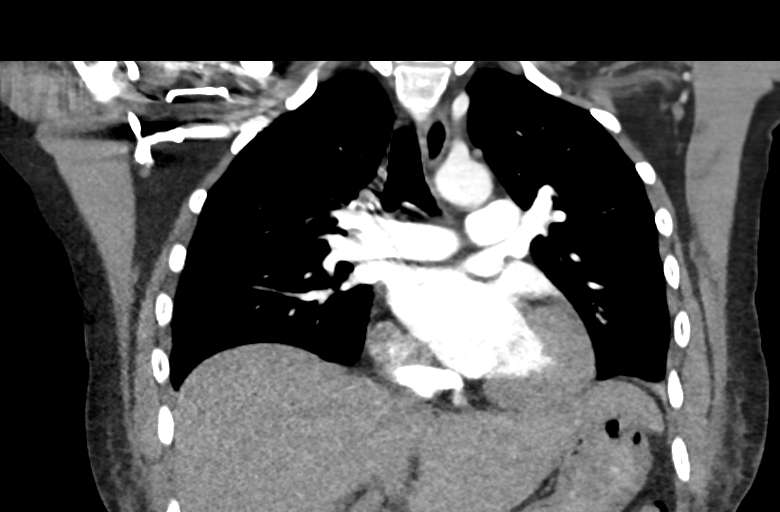
[im 103/137  soft-tissue]
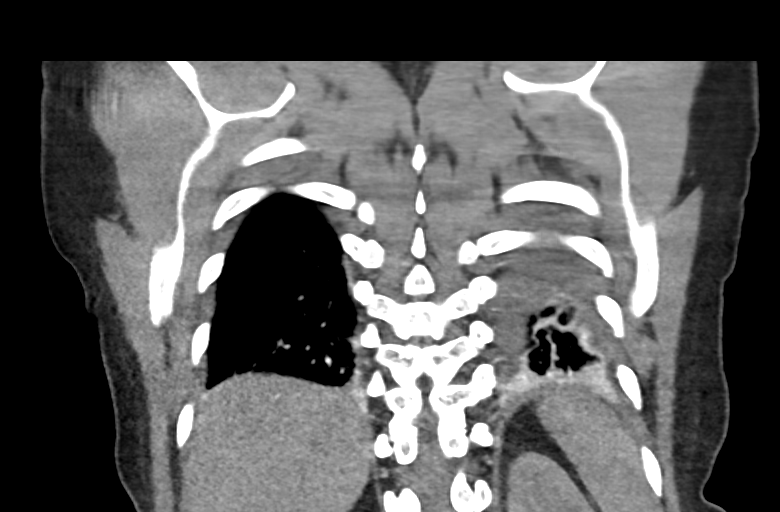

[17 of 46 positions shown; findings below may reference images not displayed]

FINDINGS: Cardiovascular: This is a technically adequate evaluation of the
pulmonary vasculature. No filling defects or pulmonary emboli.

The heart and great vessels are unremarkable without pericardial
effusion. Normal caliber of the thoracic aorta.

Mediastinum/Nodes: No enlarged mediastinal, hilar, or axillary lymph
nodes. Thyroid gland, trachea, and esophagus demonstrate no
significant findings.

Lungs/Pleura: Hypoventilatory changes are seen within the lower
lobes. There is a trace left pleural effusion. No pneumothorax.
Central airways are patent.

Upper Abdomen: No acute abnormality.

Musculoskeletal: There is gas in the soft tissues of the anterior
chest wall, primarily within the pectoralis musculature surrounding
the indwelling breast prostheses. Please correlate with any recent
surgical intervention or cosmetic surgery.

There are no acute or destructive bony lesions. Reconstructed images
demonstrate no additional findings.

Review of the MIP images confirms the above findings.
IMPRESSION: 1. No evidence of pulmonary embolus.
2. Soft tissue gas within the pectoralis musculature surrounding
indwelling bilateral breast prostheses. Please correlate with any
recent surgical intervention or cosmetic surgery.
3. Minimal dependent lower lobe atelectasis and trace left pleural
effusion. No acute airspace disease.

## 2021-12-25 ENCOUNTER — Ambulatory Visit (HOSPITAL_BASED_OUTPATIENT_CLINIC_OR_DEPARTMENT_OTHER): Payer: BC Managed Care – PPO | Admitting: Advanced Practice Midwife

## 2022-01-21 ENCOUNTER — Ambulatory Visit (HOSPITAL_BASED_OUTPATIENT_CLINIC_OR_DEPARTMENT_OTHER): Payer: BC Managed Care – PPO | Admitting: Obstetrics & Gynecology

## 2022-04-30 ENCOUNTER — Encounter (HOSPITAL_BASED_OUTPATIENT_CLINIC_OR_DEPARTMENT_OTHER): Payer: Self-pay | Admitting: Advanced Practice Midwife

## 2022-04-30 ENCOUNTER — Ambulatory Visit (HOSPITAL_BASED_OUTPATIENT_CLINIC_OR_DEPARTMENT_OTHER): Payer: BC Managed Care – PPO | Admitting: Advanced Practice Midwife

## 2022-04-30 ENCOUNTER — Other Ambulatory Visit (HOSPITAL_COMMUNITY)
Admission: RE | Admit: 2022-04-30 | Discharge: 2022-04-30 | Disposition: A | Discharge status: Home / Self Care | Payer: BC Managed Care – PPO | Source: Ambulatory Visit | Attending: Advanced Practice Midwife | Admitting: Advanced Practice Midwife

## 2022-04-30 VITALS — BP 110/75 | HR 71 | Ht 63.0 in | Wt 147.2 lb

## 2022-04-30 DIAGNOSIS — Z01419 Encounter for gynecological examination (general) (routine) without abnormal findings: Secondary | ICD-10-CM

## 2022-04-30 DIAGNOSIS — Z124 Encounter for screening for malignant neoplasm of cervix: Secondary | ICD-10-CM

## 2022-04-30 DIAGNOSIS — Z113 Encounter for screening for infections with a predominantly sexual mode of transmission: Secondary | ICD-10-CM

## 2022-04-30 DIAGNOSIS — Z30432 Encounter for removal of intrauterine contraceptive device: Secondary | ICD-10-CM

## 2022-04-30 NOTE — Progress Notes (Signed)
   Subjective:     Ashley Dixon is a 37 y.o. female here at [facility] for a routine exam.  Current complaints: none.  Pt plans a pregnancy soon.  Personal health questionnaire reviewed: yes.  Do you have a primary care provider? yes Do you feel safe at home? yes  Flowsheet Row Procedure visit from 04/30/2022 in [facility] for [facility] Total Score 0       Health Maintenance Due  Topic Date Due   COVID-19 Vaccine (4 - 2023-24 season) 09/14/2021     Risk factors for chronic health problems: Smoking: Never Alchohol/how much: socially Pt BMI: Body mass index is 26.08 kg/m.   Gynecologic History No LMP recorded. (Menstrual status: IUD). Contraception: IUD Last Pap: 2022. Results were: normal but abnormal in 2021.  Last mammogram: n/a.   Obstetric History OB History  Gravida Para Term Preterm AB Living  0 0 0 0 0 0  SAB IAB Ectopic Multiple Live Births  0 0 0 0 0     The following portions of the patient's history were reviewed and updated as appropriate: allergies, current medications, past family history, past medical history, past social history, past surgical history, and problem list.  Review of Systems Pertinent items noted in HPI and remainder of comprehensive ROS otherwise negative.    Objective:  BP 110/75 (BP Location: Right Arm, Patient Position: Sitting, Cuff Size: Large)   Pulse 71   Ht  (1.6 m) Comment: Reported  Wt 147 lb 3.2 oz (66.8 kg)   BMI 26.08 kg/m   VS reviewed, nursing note reviewed,  Constitutional: well developed, well nourished, no distress HEENT: normocephalic, thyroid without enlargement or mass HEART: RRR, no murmurs rubs/gallops RESP: clear and equal to auscultation bilaterally in all lobes  Breast Exam:  exam performed: right breast with implant, normal without mass, skin or nipple changes or axillary nodes, left breast with implant, normal without mass, skin or nipple changes or  axillary nodes Abdomen: soft Neuro: alert and oriented x 3 Skin: warm, dry Psych: affect normal Pelvic exam:Performed: Cervix pink, visually closed, without lesion, IUD string visible, ~ 2 cm from cervical os, scant white creamy discharge, vaginal walls and external genitalia normal Bimanual exam: Cervix 0/long/high, firm, anterior, neg CMT, uterus nontender, nonenlarged, adnexa without tenderness, enlargement, or mass     IUD Removal  Patient was in the dorsal lithotomy position, normal external genitalia was noted.  A speculum was placed in the patient's vagina, normal discharge was noted, no lesions. The multiparous cervix was visualized, no lesions, no abnormal discharge.  The strings of the IUD were grasped and pulled using ring forceps. The IUD was removed in its entirety. Patient tolerated the procedure well.    Patient plans for pregnancy soon and she was told to avoid teratogens, take PNV and folic acid.  Routine preventative health maintenance measures emphasized.      Assessment/Plan:   1. Routine screening for STI (sexually transmitted infection)  - RPR+HBsAg+HIV - Hepatitis C Antibody - Cytology - PAP( [place])  2. Cervical cancer screening  - Cytology - PAP( [place])  3. Encounter for annual routine gynecological examination   4. Encounter for IUD removal --[name] IUD removed without difficulty. See procedure note above.     Return in about 1 year (around 04/30/2023) for annual exam.   Sharen Counter, CNM 5:1Kathya Wilz9 PM

## 2022-05-01 LAB — RPR+HBSAG+HIV
HIV Screen 4th Generation wRfx: NONREACTIVE
Hepatitis B Surface Ag: NEGATIVE
RPR Ser Ql: NONREACTIVE

## 2022-05-01 LAB — HEPATITIS C ANTIBODY: Hep C Virus Ab: NONREACTIVE

## 2022-05-03 LAB — CYTOLOGY - PAP
Chlamydia: NEGATIVE
Comment: NEGATIVE
Comment: NEGATIVE
Comment: NEGATIVE
Comment: NORMAL
Diagnosis: NEGATIVE
High risk HPV: NEGATIVE
Neisseria Gonorrhea: NEGATIVE
Trichomonas: NEGATIVE

## 2022-07-01 ENCOUNTER — Ambulatory Visit (HOSPITAL_BASED_OUTPATIENT_CLINIC_OR_DEPARTMENT_OTHER): Payer: BC Managed Care – PPO | Admitting: Obstetrics & Gynecology

## 2022-08-28 ENCOUNTER — Other Ambulatory Visit (HOSPITAL_BASED_OUTPATIENT_CLINIC_OR_DEPARTMENT_OTHER): Payer: Self-pay | Admitting: Obstetrics & Gynecology

## 2023-05-19 ENCOUNTER — Encounter: Payer: Self-pay | Admitting: *Deleted

## 2023-05-19 DIAGNOSIS — L309 Dermatitis, unspecified: Secondary | ICD-10-CM | POA: Insufficient documentation

## 2023-05-19 DIAGNOSIS — B078 Other viral warts: Secondary | ICD-10-CM | POA: Insufficient documentation

## 2023-05-19 HISTORY — DX: Dermatitis, unspecified: L30.9

## 2023-05-19 HISTORY — DX: Other viral warts: B07.8

## 2023-05-29 DIAGNOSIS — R002 Palpitations: Secondary | ICD-10-CM | POA: Insufficient documentation

## 2023-05-29 DIAGNOSIS — K589 Irritable bowel syndrome without diarrhea: Secondary | ICD-10-CM | POA: Insufficient documentation

## 2023-06-02 ENCOUNTER — Ambulatory Visit

## 2023-06-02 VITALS — BP 110/78 | HR 73 | Ht 62.0 in | Wt 145.6 lb

## 2023-06-02 DIAGNOSIS — R002 Palpitations: Secondary | ICD-10-CM | POA: Diagnosis not present

## 2023-06-02 NOTE — Progress Notes (Signed)
 Cardiology Consultation:    Date:  06/02/2023   ID:  Ashley Dixon, DOB November 24, 1985, MRN [id number]  PCP:  Ashley Fake, MD  Cardiologist:  Ashley Evans Gaspar Fowle, MD   Referring MD: Ashley Fake, MD   No chief complaint on file.    ASSESSMENT AND PLAN:   [name] 38 year old woman with no significant chronic health issues presenting for increased symptoms of palpitations which she had felt for many months but at far less frequency and intervals.  Problem List Items Addressed This Visit     Intermittent palpitations - Primary   Reports chronic history of this over many years but recently having an increase in frequency and intensity.  No syncopal episodes.  EKG unremarkable. Will proceed with 14-day Zio patch. Will obtain transthoracic echocardiogram to rule out any significant cardiac structural and functional issues.  Advised to keep her self well-hydrated. Advised to drink enough fluids through the day and snack at frequent intervals..  She has metoprolol prescribed by PCP to use on an as-needed basis.  Advised her to take the medication for any increase in frequency of symptoms and to take no more than 1 pill a day for now.      Relevant Orders   EKG 12-Lead (Completed)   ECHOCARDIOGRAM COMPLETE   LONG TERM MONITOR (3-14 DAYS)   Return to clinic tentatively in 2 months.   History of Present Illness:    Ashley Dixon is a 38 y.o. female who is being seen today for the evaluation of palpitations at the request of Ashley Fake, MD. pleasant woman here for the visit by herself, works as a Runner, broadcasting/film/video in [employer].  Pleasant woman with no significant chronic healthcare issues from cardiac standpoint.  Has intermittent history of palpitations going on for multiple weeks but worse over the last couple weeks, episodes can occur randomly almost every day of the week, symptoms can last up to 15 minutes.  Random onset and offset.  Associated with lightheadedness.  No syncopal  episode. No obvious trigger.  Does not smoke. Has quit drinking alcohol for the last month.  EKG in the clinic today shows sinus rhythm heart rate 73/min, PR interval 136 ms, QRS duration 78 ms, QTc 412 ms.  Blood work from 05-01-2023 BUN 8, creatinine 0.68, EGFR 115 Sodium 137, potassium 4.4 Normal transaminases and alkaline phosphatase TSH normal 2.19 Hemoglobin 13.4, hematocrit 40.6  Past Medical History:  Diagnosis Date   Abnormal Pap smear of cervix    Allergic rhinitis    Allergies    Anxiety disorder    BMI 25.0-25.9,adult    CTS (carpal tunnel syndrome)    Facial dermatitis 05/19/2023   Hormonal contraceptive    Pt has IUD in situ   HSV-2 (herpes simplex virus 2) infection 2014   IBS (irritable bowel syndrome)    Intermittent left-sided chest pain 08/09/2020   Intermittent palpitations    IUD (intrauterine device) in place 01/12/2015   Sklya placed 2/16   Migraine    Other viral warts 05/19/2023   Reactive airway disease    Recurrent candidiasis of vagina    Vitamin D  deficiency     Past Surgical History:  Procedure Laterality Date   CHALAZION EXCISION  05/09/2017   CHOLECYSTECTOMY  2005   INTRAUTERINE DEVICE INSERTION     02-14-17 removal of skyla & reinsertion of kyleena    PLACEMENT OF BREAST IMPLANTS Bilateral 07/2012    Current Medications: Current Meds  Medication Sig   acyclovir  (ZOVIRAX ) 400 MG tablet  TAKE 1 TABLET(400 MG) BY MOUTH TWICE DAILY   albuterol (VENTOLIN HFA) 108 (90 Base) MCG/ACT inhaler Inhale 2 puffs into the lungs 3 (three) times daily as needed for wheezing or shortness of breath.   Calcium Carb-Cholecalciferol (NEOFLEX CALCIUM + VITAMIN D ) 600-12.5 MG-MCG TABS Take 1 tablet by mouth 2 (two) times daily.   fluticasone furoate-vilanterol (BREO ELLIPTA) 100-25 MCG/ACT AEPB Inhale 1 puff into the lungs daily.   metoprolol tartrate (LOPRESSOR) 25 MG tablet Take 25 mg by mouth as needed (palpitations).   montelukast (SINGULAIR) 10 MG  tablet Take 10 mg by mouth at bedtime.    Vitamin D , Ergocalciferol , (DRISDOL) 1.25 MG (50000 UNIT) CAPS capsule Take 50,000 Units by mouth once a week.     Allergies:   Patient has no known allergies.   Social History   Socioeconomic History   Marital status: Single    Spouse name: Not on file   Number of children: Not on file   Years of education: Not on file   Highest education level: Not on file  Occupational History   Not on file  Tobacco Use   Smoking status: Never   Smokeless tobacco: Never  Vaping Use   Vaping status: Never Used  Substance and Sexual Activity   Alcohol use: Never   Drug use: No   Sexual activity: Yes    Birth control/protection: I.U.D.    Comment: [name]  placed 02/14/17  Other Topics Concern   Not on file  Social History Narrative   Not on file   Social Drivers of Health   Financial Resource Strain: Not on file  Food Insecurity: Not on file  Transportation Needs: Not on file  Physical Activity: Not on file  Stress: Not on file  Social Connections: Not on file     Family History: The patient's family history includes Diabetes (age of onset: 38) in her father; Osteoarthritis in her maternal grandfather and mother; Thyroid cancer (age of onset: 46) in her maternal aunt. ROS:   Please see the history of present illness.    All 14 point review of systems negative except as described per history of present illness.  EKGs/Labs/Other Studies Reviewed:    The following studies were reviewed today:   EKG:  EKG Interpretation Date/Time:  Monday Jun 02 2023 16:10:17 EDT Ventricular Rate:  73 PR Interval:  136 QRS Duration:  78 QT Interval:  374 QTC Calculation: 412 R Axis:   71  Text Interpretation: Normal sinus rhythm with sinus arrhythmia Normal ECG When compared with ECG of 28-Jul-2019 15:41, No significant change was found Confirmed by Ashley Dixon [phone]) on 06/02/2023 4:28:16 PM    Recent Labs: No results found for  requested labs within last 365 days.  Recent Lipid Panel    Component Value Date/Time   CHOL 150 01/29/2016 1535   TRIG 152 (H) 01/29/2016 1535   HDL 43 (L) 01/29/2016 1535   CHOLHDL 3.5 01/29/2016 1535   VLDL 30 01/29/2016 1535   LDLCALC 77 01/29/2016 1535    Physical Exam:    VS:  BP 110/78   Pulse 73   Ht 5\' 2"  (1.575 m)   Wt 145 lb 9.6 oz (66 kg)   SpO2 98%   BMI 26.63 kg/m     Wt Readings from Last 3 Encounters:  06/02/23 145 lb 9.6 oz (66 kg)  05/01/23 150 lb 3 oz (68.1 kg)  04/30/22 147 lb 3.2 oz (66.8 kg)     GENERAL:  Well  nourished, well developed in no acute distress NECK: No JVD; No carotid bruits CARDIAC: RRR, S1 and S2 present, no murmurs, no rubs, no gallops CHEST:  Clear to auscultation without rales, wheezing or rhonchi  Extremities: No pitting pedal edema. Pulses bilaterally symmetric with radial 2+ and dorsalis pedis 2+ NEUROLOGIC:  Alert and oriented x 3  Medication Adjustments/Labs and Tests Ordered: Current medicines are reviewed at length with the patient today.  Concerns regarding medicines are outlined above.  Orders Placed This Encounter  Procedures   LONG TERM MONITOR (3-14 DAYS)   EKG 12-Lead   ECHOCARDIOGRAM COMPLETE   No orders of the defined types were placed in this encounter.   Signed, Chole Driver Dixon Fabion Gatson, MD, MPH, [facility]. 06/02/2023 5:21 PM    [facility]

## 2023-06-02 NOTE — Assessment & Plan Note (Addendum)
 Reports chronic history of this over many years but recently having an increase in frequency and intensity.  No syncopal episodes.  EKG unremarkable. Will proceed with 14-day Zio patch. Will obtain transthoracic echocardiogram to rule out any significant cardiac structural and functional issues.  Advised to keep her self well-hydrated. Advised to drink enough fluids through the day and snack at frequent intervals..  She has metoprolol prescribed by PCP to use on an as-needed basis.  Advised her to take the medication for any increase in frequency of symptoms and to take no more than 1 pill a day for now.

## 2023-06-02 NOTE — Patient Instructions (Signed)
 Medication Instructions:  Your physician recommends that you continue on your current medications as directed. Please refer to the Current Medication list given to you today.  *If you need a refill on your cardiac medications before your next appointment, please call your pharmacy*  Lab Work: None If you have labs (blood work) drawn today and your tests are completely normal, you will receive your results only by: MyChart Message (if you have MyChart) OR A paper copy in the mail If you have any lab test that is abnormal or we need to change your treatment, we will call you to review the results.  Testing/Procedures: Your physician has requested that you have an echocardiogram. Echocardiography is a painless test that uses sound waves to create images of your heart. It provides your doctor with information about the size and shape of your heart and how well your heart's chambers and valves are working. This procedure takes approximately one hour. There are no restrictions for this procedure. Please do NOT wear cologne, perfume, aftershave, or lotions (deodorant is allowed). Please arrive 15 minutes prior to your appointment time.  Please note: We ask at that you not bring children with you during ultrasound (echo/ vascular) testing. Due to room size and safety concerns, children are not allowed in the ultrasound rooms during exams. Our front office staff cannot provide observation of children in our lobby area while testing is being conducted. An adult accompanying a patient to their appointment will only be allowed in the ultrasound room at the discretion of the ultrasound technician under special circumstances. We apologize for any inconvenience.  A zio monitor was ordered today. It will remain on for 14 days. You will then return monitor and event diary in provided box. It takes 1-2 weeks for report to be downloaded and returned to us . We will call you with the results. If monitor falls off or  has orange flashing light, please call Zio for further instructions.    Follow-Up: At [facility], you and your health needs are our priority.  As part of our continuing mission to provide you with exceptional heart care, our providers are all part of one team.  This team includes your primary Cardiologist (physician) and Advanced Practice Providers or APPs (Physician Assistants and Nurse Practitioners) who all work together to provide you with the care you need, when you need it.  Your next appointment:   6 week(s)  Provider:   Bertha Broad, MD    We recommend signing up for the patient portal called "MyChart".  Sign up information is provided on this After Visit Summary.  MyChart is used to connect with patients for Virtual Visits (Telemedicine).  Patients are able to view lab/test results, encounter notes, upcoming appointments, etc.  Non-urgent messages can be sent to your provider as well.   To learn more about what you can do with MyChart, go to [email or web address].   Other Instructions None

## 2023-06-27 ENCOUNTER — Ambulatory Visit (HOSPITAL_BASED_OUTPATIENT_CLINIC_OR_DEPARTMENT_OTHER): Payer: Self-pay | Admitting: Obstetrics & Gynecology

## 2023-07-16 ENCOUNTER — Ambulatory Visit

## 2023-07-16 DIAGNOSIS — R002 Palpitations: Secondary | ICD-10-CM | POA: Diagnosis not present

## 2023-07-17 ENCOUNTER — Ambulatory Visit: Payer: Self-pay

## 2023-07-17 DIAGNOSIS — R002 Palpitations: Secondary | ICD-10-CM | POA: Diagnosis not present

## 2023-07-17 LAB — ECHOCARDIOGRAM COMPLETE
Area-P 1/2: 3.6 cm2
S' Lateral: 2.4 cm

## 2023-10-15 ENCOUNTER — Other Ambulatory Visit (HOSPITAL_BASED_OUTPATIENT_CLINIC_OR_DEPARTMENT_OTHER): Payer: Self-pay

## 2023-10-15 MED ORDER — ACYCLOVIR 400 MG PO TABS: 400.0000 mg | ORAL_TABLET | Freq: Two times a day (BID) | ORAL | 0 refills | Status: DC

## 2023-10-21 ENCOUNTER — Ambulatory Visit (HOSPITAL_BASED_OUTPATIENT_CLINIC_OR_DEPARTMENT_OTHER): Payer: Self-pay | Admitting: Obstetrics & Gynecology

## 2023-10-29 ENCOUNTER — Ambulatory Visit (INDEPENDENT_AMBULATORY_CARE_PROVIDER_SITE_OTHER): Payer: Self-pay | Admitting: Obstetrics & Gynecology

## 2023-10-29 ENCOUNTER — Encounter (HOSPITAL_BASED_OUTPATIENT_CLINIC_OR_DEPARTMENT_OTHER): Payer: Self-pay | Admitting: Obstetrics & Gynecology

## 2023-10-29 VITALS — BP 132/85 | HR 84 | Ht 63.0 in | Wt 140.2 lb

## 2023-10-29 DIAGNOSIS — N979 Female infertility, unspecified: Secondary | ICD-10-CM

## 2023-10-29 DIAGNOSIS — Z319 Encounter for procreative management, unspecified: Secondary | ICD-10-CM

## 2023-10-29 DIAGNOSIS — R1904 Left lower quadrant abdominal swelling, mass and lump: Secondary | ICD-10-CM

## 2023-10-29 DIAGNOSIS — Z01419 Encounter for gynecological examination (general) (routine) without abnormal findings: Secondary | ICD-10-CM

## 2023-10-29 DIAGNOSIS — B009 Herpesviral infection, unspecified: Secondary | ICD-10-CM | POA: Diagnosis not present

## 2023-10-29 DIAGNOSIS — Z01411 Encounter for gynecological examination (general) (routine) with abnormal findings: Secondary | ICD-10-CM | POA: Diagnosis not present

## 2023-10-29 MED ORDER — ACYCLOVIR 400 MG PO TABS: 400.0000 mg | ORAL_TABLET | Freq: Two times a day (BID) | ORAL | 3 refills | Status: AC

## 2023-10-29 MED ORDER — PRENATAL VITAMIN 27-0.8 MG PO TABS: 1.0000 | ORAL_TABLET | Freq: Every day | ORAL | Status: AC

## 2023-10-29 NOTE — Progress Notes (Signed)
 ANNUAL EXAM Patient name: Ashley Dixon MRN [id number]  Date of birth: 30-Sep-1985 Chief Complaint:   Gynecologic Exam (Patient reports that she had a bump on her left hip that she saw her PCP for, they prescribed her an antibiotic and it went away but has returned. Patient has no other complaints or concerns at this time. /)  History of Present Illness:   Ashley Dixon is a 38 y.o. G0P0000 Hispanic female being seen today for a routine annual exam. Had IUD removed.  Has been trying for pregnancy.  Cycles are regular and last 3-4 days.  Pt feels cycles have gotten shorter.  We discussed doing a semen analysis, testing AMH and day 21 progesterone level, and doing a sonohysterography for tubal patency.   Unrelated, she had pain and pressure in her left lower abdomen, towards her left hip.  She then developed a bump there.  She saw here PCP who put her on an antibiotic. This resolved after about a week.  The pain and pressure have resolved but she is still feeling the bump.    LMP: 10/13/2023  Last pap 04/30/2022. Results were: NILM w/ HRHPV negative. H/O abnormal pap: yes. Patient reports about 3-4 years ago Last mammogram: Family h/o breast cancer: no Last colonoscopy: Family h/o colorectal cancer: no     10/29/2023    1:46 PM 04/30/2022    3:53 PM 02/05/2021   10:26 AM 10/05/2020    4:37 PM  Depression screen PHQ 2/9  Decreased Interest 0 0 0 0  Down, Depressed, Hopeless 0 0 0 0  PHQ - 2 Score 0 0 0 0    Review of Systems:   Pertinent items are noted in HPI Denies any urinary or bowel changes.  Denies pelvic pain.   Pertinent History Reviewed:  Reviewed past medical,surgical, social and family history.  Reviewed problem list, medications and allergies. Physical Assessment:   Vitals:   10/29/23 1345  BP: 132/85  Pulse: 84  SpO2: 100%  Weight: 140 lb 3.2 oz (63.6 kg)  Height: 5' 3 (1.6 m)  Body mass index is 24.84 kg/m.        Physical Examination:   General appearance -  well appearing, and in no distress  Mental status - alert, oriented to person, place, and time  Psych:  She has a normal mood and affect  Skin - warm and dry, normal color, no suspicious lesions noted  Chest - effort normal, all lung fields clear to auscultation bilaterally  Heart - normal rate and regular rhythm  Neck:  midline trachea, no thyromegaly or nodules  Breasts - breasts appear normal, no suspicious masses, no skin or nipple changes or  axillary nodes  Abdomen - soft, normal bowel sounds, tender soft mass in left inguinal region more prominent when she does a crunch with more bulging present as well.  Findings c/w hernia.    Pelvic - VULVA: normal appearing vulva with no masses, tenderness or lesions   VAGINA: normal appearing vagina with normal color and discharge, no lesions   CERVIX: normal appearing cervix without discharge or lesions, no CMT  Thin prep pap is not indicated today  UTERUS: uterus is felt to be normal size, shape, consistency and nontender   ADNEXA: No adnexal masses or tenderness noted.  Rectal - deferred  Extremities:  No swelling or varicosities noted  Chaperone present for exam  No results found for this or any previous visit (from the past 24 hours).  Assessment & Plan:  1. Well woman exam with routine gynecological exam (Primary) - Pap smear neg with neg HR HPV 2024.  H/o ASCUS pap without HR HPV 2020 and ASCUS with neg HR HPV 2021 - Mammogram guidelines reviewed - Colonoscopy due age 23 - lab work done with PCP,  - vaccines reviewed/updated  2. HSV (herpes simplex virus) infection - acyclovir  (ZOVIRAX ) 400 MG tablet; Take 1 tablet (400 mg total) by mouth 2 (two) times daily.  Dispense: 60 tablet; Refill: 3  3. Patient desires pregnancy - Discussed starting PNV.  Testing for ovulation including ovulation predictor testing and day 21 progesterone levels discussed.  Will plan to do this with next cycle as day 21 for this month will fall on a Sunday.   SHGM for tubal patency also discussed.  This must be done no later than day 10 of a cycle since she is actively trying for pregnancy.  She knows this may be a few month to get scheduled due to specific timing needs.  REI referral discussed as well.  For now, she does not desire referral.    4. LLQ abdominal mass - will proceed with CT scan and possible general surgery referral. - discussed with pt antibiotics are not indicated for this finding   No orders of the defined types were placed in this encounter.   Meds:  Meds ordered this encounter  Medications   Prenatal Vit-Fe Fumarate-FA (PRENATAL VITAMIN) 27-0.8 MG TABS    Sig: Take 1 tablet by mouth daily.   Lengthy AEX visit with pt today as we addressed concerns regarding pregnancy, evaluation for infertility as well as finding of possible hernia with work-up and treatment.  Total time with pt was 45 minutes.  Additional documentation was an additional 7 minutes.  Total time was 52 minutes with at least 20 minutes spend on discussing the additional concerns as per above which is outside the scope of typical AEX appointment.    Ronal GORMAN Pinal, MD 10/29/2023 2:22 PM

## 2023-10-30 ENCOUNTER — Encounter (HOSPITAL_BASED_OUTPATIENT_CLINIC_OR_DEPARTMENT_OTHER): Payer: Self-pay

## 2023-10-31 ENCOUNTER — Telehealth (HOSPITAL_BASED_OUTPATIENT_CLINIC_OR_DEPARTMENT_OTHER): Payer: Self-pay

## 2023-10-31 DIAGNOSIS — R1904 Left lower quadrant abdominal swelling, mass and lump: Secondary | ICD-10-CM | POA: Insufficient documentation

## 2023-10-31 NOTE — Telephone Encounter (Signed)
 Spoke with patient. Advised of message as seen below from Dr.MIller. Patient verbalizes understanding. Patient will contact the office with there first day of her next menses for [facility] scheduling.   Spoke with [employer]. PA is not required for CT abd and pelvis with contrast (CPT 74177). Call reference number [id number].

## 2023-10-31 NOTE — Telephone Encounter (Signed)
-----   Message from Ronal GORMAN Pinal sent at 10/31/2023  7:05 AM EDT ----- Regarding: CT scan and sonohysterogram Pt has LLQ abdominal mass that I think is a hernia.  CT ordered to be done at [facility].  Can you please just remind her that radiology should call to schedule this.  There is x ray with this exam so she needs to be 100% sure she is not early pregnant when this is done.  I would suggest scheduling when she knows she will be on her cycle or the week after.  Also, I ordered a sononhysterogram for her.  We looked at the calendar and thing Dec 3 will work but she needs to be no later than day 10 of her cycle for this so I would like her to reach out when she starts her cycle in late November to be completely sure as she is trying for pregnancy.  Thank you.  Elvie

## 2023-11-18 ENCOUNTER — Other Ambulatory Visit (HOSPITAL_BASED_OUTPATIENT_CLINIC_OR_DEPARTMENT_OTHER): Payer: Self-pay | Admitting: Obstetrics & Gynecology

## 2023-11-18 DIAGNOSIS — B009 Herpesviral infection, unspecified: Secondary | ICD-10-CM

## 2023-12-10 NOTE — Telephone Encounter (Signed)
 Have attempted to call patient x 3 for information for referral for semen analysis.   Morna LOISE Quale, RN

## 2024-01-06 ENCOUNTER — Encounter (HOSPITAL_BASED_OUTPATIENT_CLINIC_OR_DEPARTMENT_OTHER): Payer: Self-pay

## 2024-01-06 ENCOUNTER — Ambulatory Visit (HOSPITAL_BASED_OUTPATIENT_CLINIC_OR_DEPARTMENT_OTHER): Payer: Self-pay

## 2024-01-06 VITALS — BP 121/78 | HR 72 | Wt 146.0 lb

## 2024-01-06 DIAGNOSIS — O3680X0 Pregnancy with inconclusive fetal viability, not applicable or unspecified: Secondary | ICD-10-CM

## 2024-01-06 DIAGNOSIS — Z3A08 8 weeks gestation of pregnancy: Secondary | ICD-10-CM

## 2024-01-06 DIAGNOSIS — Z3201 Encounter for pregnancy test, result positive: Secondary | ICD-10-CM

## 2024-01-06 DIAGNOSIS — N912 Amenorrhea, unspecified: Secondary | ICD-10-CM

## 2024-01-06 LAB — POCT URINE PREGNANCY: Preg Test, Ur: POSITIVE — AB

## 2024-01-06 MED ORDER — PROMETHAZINE HCL 25 MG PO TABS: 25.0000 mg | ORAL_TABLET | Freq: Four times a day (QID) | ORAL | 0 refills | Status: AC | PRN

## 2024-01-06 MED ORDER — PRENATAL 27-1 MG PO TABS: 1.0000 | ORAL_TABLET | Freq: Every day | ORAL | 11 refills | Status: AC

## 2024-01-06 NOTE — Progress Notes (Signed)
 NURSE VISIT- PREGNANCY CONFIRMATION   SUBJECTIVE:  Ashley Dixon is a 38 y.o. G1P0000 female at [redacted]w[redacted]d by uncertain LMP. Patient's last menstrual period was 11/10/2023 (approximate). Here for pregnancy confirmation.  Home pregnancy test: positive x 2  She reports nausea.  She is not taking prenatal vitamins.    I explained I am completing New OB Intake today. We discussed EDD of 08/16/2024 based on LMP of 11/10/2023. I reviewed her allergies, medications and Medical/Surgical/OB history.    Patient Active Problem List   Diagnosis Date Noted   LLQ abdominal mass 10/31/2023   IBS (irritable bowel syndrome)    Intermittent palpitations    Facial dermatitis 05/19/2023   Other viral warts 05/19/2023   Abnormal Pap smear of cervix 08/09/2020   Allergic rhinitis 08/09/2020   Allergies 08/09/2020   Anxiety disorder 08/09/2020   BMI 25.0-25.9,adult 08/09/2020   CTS (carpal tunnel syndrome) 08/09/2020   Migraine 08/09/2020   Reactive airway disease 08/09/2020   Vitamin D  deficiency 08/09/2020   HSV-2 (herpes simplex virus 2) infection 2014     Concerns addressed today  MyChart/Babyscripts MyChart access verified. I explained pt will have some visits in office and some virtually. Babyscripts instructions given and order placed. Patient verifies receipt of registration text/e-mail. Account successfully created and app downloaded.   Blood Pressure Cuff/Weight Scale Patient has private insurance; instructed to purchase blood pressure cuff and bring to first prenatal appt. Explained after first prenatal appt pt will check weekly and document in Babyscripts. Patient does not have weight scale; patient may purchase if they desire to track weight weekly in Babyscripts.  Is patient a candidate for Babyscripts Optimization? Yes, patient accepted    Last Pap Diagnosis  Date Value Ref Range Status  04/30/2022   Final   - Negative for intraepithelial lesion or malignancy (NILM)    OBJECTIVE:  BP  121/78 (BP Location: Left Arm, Patient Position: Sitting, Cuff Size: Normal)   Pulse 72   Wt 146 lb (66.2 kg)   LMP 11/10/2023 (Approximate)   SpO2 100%   BMI 25.86 kg/m   Appears well, in no apparent distress  Results for orders placed or performed in visit on 01/06/24 (from the past 24 hours)  POCT urine pregnancy   Collection Time: 01/06/24  9:43 AM  Result Value Ref Range   Preg Test, Ur Positive (A) Negative    ASSESSMENT: Positive UPT in office.    PLAN: Prenatal vitamins: Prenatal 27-1 mg take 1 tablet daily #30 11RF sent to pharmacy on file. Nausea medicines: Phenergan  25 mg take 1 tablet q6h PRN for nausea. New OB appointment scheduled for 02/10/2024 at 11:15 am with Nidia Daring, NP. New OB packet provided and reviewed with patient. Advised to return completed paperwork to new OB appointment. Viability scan ordered and scheduled for 02/04/2024 at 11:30 am.  Arhianna Ebey E, RN 01/06/2024  9:44 AM

## 2024-01-12 ENCOUNTER — Ambulatory Visit (HOSPITAL_BASED_OUTPATIENT_CLINIC_OR_DEPARTMENT_OTHER)

## 2024-01-12 ENCOUNTER — Encounter (HOSPITAL_BASED_OUTPATIENT_CLINIC_OR_DEPARTMENT_OTHER): Payer: Self-pay

## 2024-01-12 VITALS — BP 115/82 | HR 72 | Wt 145.8 lb

## 2024-01-12 DIAGNOSIS — R350 Frequency of micturition: Secondary | ICD-10-CM

## 2024-01-12 DIAGNOSIS — R109 Unspecified abdominal pain: Secondary | ICD-10-CM | POA: Diagnosis not present

## 2024-01-12 LAB — POCT URINALYSIS DIPSTICK
Bilirubin, UA: NEGATIVE
Blood, UA: NEGATIVE
Glucose, UA: NEGATIVE
Ketones, UA: NEGATIVE
Leukocytes, UA: NEGATIVE
Nitrite, UA: NEGATIVE
Protein, UA: NEGATIVE
Spec Grav, UA: 1.015
Urobilinogen, UA: 0.2 U/dL
pH, UA: 6

## 2024-01-12 NOTE — Progress Notes (Signed)
 NURSE VISIT- UTI SYMPTOMS   SUBJECTIVE:  Ashley Dixon is a 38 y.o. G105P0000 female here for UTI symptoms. She is [redacted]w[redacted]d pregnant. She reports lower abdominal pain and urinary frequency.  OBJECTIVE:  BP 115/82   Pulse 72   Wt 145 lb 12.8 oz (66.1 kg)   LMP 11/10/2023 (Approximate)   BMI 25.83 kg/m   Appears well, in no apparent distress  No results found for this or any previous visit (from the past 24 hours).  ASSESSMENT: Pregnancy [redacted]w[redacted]d with UTI symptoms and negative nitrites  PLAN: Visit routed to or discussed with:  Rx sent today: No Urine culture sent Call or return to clinic prn if these symptoms worsen or fail to improve as anticipated. Follow-up: as scheduled

## 2024-01-14 ENCOUNTER — Ambulatory Visit (HOSPITAL_BASED_OUTPATIENT_CLINIC_OR_DEPARTMENT_OTHER): Payer: Self-pay | Admitting: Obstetrics & Gynecology

## 2024-01-14 LAB — URINE CULTURE: Organism ID, Bacteria: NO GROWTH

## 2024-01-29 ENCOUNTER — Encounter (HOSPITAL_BASED_OUTPATIENT_CLINIC_OR_DEPARTMENT_OTHER): Payer: Self-pay | Admitting: Obstetrics & Gynecology

## 2024-02-04 ENCOUNTER — Ambulatory Visit (HOSPITAL_BASED_OUTPATIENT_CLINIC_OR_DEPARTMENT_OTHER)

## 2024-02-04 ENCOUNTER — Other Ambulatory Visit (HOSPITAL_COMMUNITY)
Admission: RE | Admit: 2024-02-04 | Discharge: 2024-02-04 | Disposition: A | Source: Ambulatory Visit | Attending: Obstetrics & Gynecology | Admitting: Obstetrics & Gynecology

## 2024-02-04 ENCOUNTER — Encounter (HOSPITAL_BASED_OUTPATIENT_CLINIC_OR_DEPARTMENT_OTHER): Payer: Self-pay

## 2024-02-04 VITALS — BP 111/81 | HR 82 | Wt 149.2 lb

## 2024-02-04 DIAGNOSIS — N898 Other specified noninflammatory disorders of vagina: Secondary | ICD-10-CM

## 2024-02-04 DIAGNOSIS — Z3A11 11 weeks gestation of pregnancy: Secondary | ICD-10-CM | POA: Diagnosis not present

## 2024-02-04 DIAGNOSIS — Z3687 Encounter for antenatal screening for uncertain dates: Secondary | ICD-10-CM | POA: Diagnosis not present

## 2024-02-04 DIAGNOSIS — O3680X0 Pregnancy with inconclusive fetal viability, not applicable or unspecified: Secondary | ICD-10-CM

## 2024-02-04 NOTE — Progress Notes (Signed)
 NURSE VISIT- VAGINITIS/STD/POC  SUBJECTIVE:  Ashley Dixon is a 39 y.o. G1P0000 [redacted]w[redacted]d pregnantfemale here for a vaginal swab for vaginitis screening.  She reports the following symptoms: discharge described as creamy, odor, and vulvar itching for several days. Denies abnormal vaginal bleeding, significant pelvic pain, fever, or UTI symptoms.  OBJECTIVE:  BP 111/81   Pulse 82   Wt 149 lb 3.2 oz (67.7 kg)   LMP 11/10/2023 (Approximate)   BMI 26.43 kg/m   Appears well, in no apparent distress  ASSESSMENT: Vaginal swab for vaginitis screening  PLAN: Self-collected vaginal probe for Bacterial Vaginosis, Yeast sent to lab Treatment: to be determined once results are received Follow-up as needed if symptoms persist/worsen, or new symptoms develop

## 2024-02-05 LAB — CERVICOVAGINAL ANCILLARY ONLY
Bacterial Vaginitis (gardnerella): NEGATIVE
Candida Glabrata: NEGATIVE
Candida Vaginitis: POSITIVE — AB
Comment: NEGATIVE
Comment: NEGATIVE
Comment: NEGATIVE

## 2024-02-06 ENCOUNTER — Ambulatory Visit (HOSPITAL_BASED_OUTPATIENT_CLINIC_OR_DEPARTMENT_OTHER): Payer: Self-pay | Admitting: Obstetrics & Gynecology

## 2024-02-06 MED ORDER — TERCONAZOLE 0.4 % VA CREA: 1.0000 | TOPICAL_CREAM | Freq: Every day | VAGINAL | 0 refills | Status: DC

## 2024-02-10 ENCOUNTER — Other Ambulatory Visit (HOSPITAL_BASED_OUTPATIENT_CLINIC_OR_DEPARTMENT_OTHER): Payer: Self-pay

## 2024-02-10 ENCOUNTER — Ambulatory Visit (INDEPENDENT_AMBULATORY_CARE_PROVIDER_SITE_OTHER): Payer: Self-pay | Admitting: Obstetrics and Gynecology

## 2024-02-10 ENCOUNTER — Other Ambulatory Visit (HOSPITAL_COMMUNITY)
Admission: RE | Admit: 2024-02-10 | Discharge: 2024-02-10 | Disposition: A | Discharge status: Home / Self Care | Source: Ambulatory Visit | Attending: Obstetrics and Gynecology | Admitting: Obstetrics and Gynecology

## 2024-02-10 ENCOUNTER — Encounter (HOSPITAL_BASED_OUTPATIENT_CLINIC_OR_DEPARTMENT_OTHER): Payer: Self-pay | Admitting: Obstetrics and Gynecology

## 2024-02-10 VITALS — BP 110/87 | HR 84 | Wt 150.6 lb

## 2024-02-10 DIAGNOSIS — B009 Herpesviral infection, unspecified: Secondary | ICD-10-CM

## 2024-02-10 DIAGNOSIS — O09511 Supervision of elderly primigravida, first trimester: Secondary | ICD-10-CM

## 2024-02-10 DIAGNOSIS — Z3401 Encounter for supervision of normal first pregnancy, first trimester: Secondary | ICD-10-CM | POA: Insufficient documentation

## 2024-02-10 DIAGNOSIS — O98511 Other viral diseases complicating pregnancy, first trimester: Secondary | ICD-10-CM

## 2024-02-10 DIAGNOSIS — Z3A13 13 weeks gestation of pregnancy: Secondary | ICD-10-CM

## 2024-02-10 DIAGNOSIS — O09522 Supervision of elderly multigravida, second trimester: Secondary | ICD-10-CM | POA: Insufficient documentation

## 2024-02-10 DIAGNOSIS — Z3480 Encounter for supervision of other normal pregnancy, unspecified trimester: Secondary | ICD-10-CM | POA: Insufficient documentation

## 2024-02-10 DIAGNOSIS — Z1332 Encounter for screening for maternal depression: Secondary | ICD-10-CM

## 2024-02-10 MED ORDER — ASPIRIN 81 MG PO TBEC: 81.0000 mg | DELAYED_RELEASE_TABLET | Freq: Every day | ORAL | 2 refills | Status: AC

## 2024-02-10 MED ORDER — TERCONAZOLE 0.4 % VA CREA
1.0000 | TOPICAL_CREAM | Freq: Every day | VAGINAL | 0 refills | Status: AC
  Filled 2024-02-10: qty 45, 7d supply, fill #0

## 2024-02-10 NOTE — Progress Notes (Signed)
 "  INITIAL PRENATAL VISIT  Subjective:   Ashley Dixon is being seen today for her first obstetrical visit.  She is at [redacted]w[redacted]d gestation by LMP Her obstetrical history is significant for advanced maternal age. Relationship with FOB: significant other, living together. Patient does intend to breast feed. Pregnancy history fully reviewed.  Patient reports no complaints.  Delivery Plans Plans to deliver at [facility]. Discussed the nature of our practice with multiple providers including residents and students as well as female and female providers. Due to the size of the practice, the delivering provider may not be the same as those providing prenatal care.    Patient is not interested in water birth.   Anatomy US  Explained first scheduled US  will be around 19 weeks. Anatomy US  ordered.    Is patient a candidate for Babyscripts Optimization? Yes  Pap smear history: 04/30/2022 NILM HPV neg   Objective:    Obstetric History OB History  Gravida Para Term Preterm AB Living  1 0 0 0 0 0  SAB IAB Ectopic Multiple Live Births  0 0 0 0 0    # Outcome Date GA Lbr Len/2nd Weight Sex Type Anes PTL Lv  1 Current             Past Medical History:  Diagnosis Date   Abnormal Pap smear of cervix    Allergic rhinitis    Allergies    Anxiety disorder    BMI 25.0-25.9,adult    CTS (carpal tunnel syndrome)    Facial dermatitis 05/19/2023   Hormonal contraceptive    Pt has IUD in situ   HSV-2 (herpes simplex virus 2) infection 2014   IBS (irritable bowel syndrome)    Intermittent left-sided chest pain 08/09/2020   Intermittent palpitations    IUD (intrauterine device) in place 01/12/2015   Sklya placed 2/16   Migraine    Other viral warts 05/19/2023   Reactive airway disease    Recurrent candidiasis of vagina    Vitamin D  deficiency     Past Surgical History:  Procedure Laterality Date   CHALAZION EXCISION  05/09/2017   CHOLECYSTECTOMY  2005   INTRAUTERINE DEVICE INSERTION      02-14-17 removal of skyla & reinsertion of kyleena    PLACEMENT OF BREAST IMPLANTS Bilateral 07/2012    Medications Ordered Prior to Encounter[1]  Allergies[2]  Social History:  reports that she has never smoked. She has never used smokeless tobacco. She reports that she does not drink alcohol and does not use drugs.  Family History  Problem Relation Age of Onset   Osteoarthritis Mother    Osteoarthritis Maternal Grandfather    Diabetes Father 68       type 2   Thyroid cancer Maternal Aunt 21    The following portions of the patient's history were reviewed and updated as appropriate: allergies, current medications, past family history, past medical history, past social history, past surgical history and problem list.  Review of Systems Review of Systems  All other systems reviewed and are negative.   Physical Exam:  BP 110/87   Pulse 84   Wt 150 lb 9.6 oz (68.3 kg)   LMP 11/10/2023 (Approximate)   BMI 26.68 kg/m  CONSTITUTIONAL: Well-developed, well-nourished female in no acute distress.  HENT:  Normocephalic, atraumatic.   SKIN: Skin is warm and dry. MUSCULOSKELETAL: Normal range of motion NEUROLOGIC: Alert and oriented  PSYCHIATRIC: Normal mood and affect. Normal behavior.  RESPIRATORY: normal effort ABDOMEN: Soft PELVIC:deferred  Assessment:    Pregnancy: G1P0000  1. Encounter for supervision of normal first pregnancy in first trimester (Primary) BP and FHR normal  - ABO/Rh - Antibody screen - CBC - Hepatitis B surface antigen - HIV Antibody (routine testing w rflx) - HIV (Save tube for possible reflex) - RPR - Rubella screen - Hepatitis C antibody - HgB A1c - Cervicovaginal ancillary only( [place]) - Culture, OB Urine - PANORAMA PRENATAL TEST - HORIZON Basic Panel - US  MFM OB DETAIL +14 WK; Future  2. [redacted] weeks gestation of pregnancy  3. Multigravida of advanced maternal age in second trimester Discussed rec for ASA in pregnancy, rx sent  -  US  MFM OB DETAIL +14 WK; Future  4. HSV-2 (herpes simplex virus 2) infection She thought she was starting an outbreak a week or so ago, she is unsure, she has symptomatic tx  Discussed suppression at 34-36 weeks   5. Supervision of other normal pregnancy, antepartum      Plan:     Initial labs drawn. Prenatal vitamins. Problem list reviewed and updated. Reviewed in detail the nature of the practice with collaborative care between  Genetic screening discussed: NIPS/First trimester screen/Quad/AFP ordered. Role of ultrasound in pregnancy discussed; Anatomy US : ordered. Follow up in 4 weeks. Weight gain recommendations per IOM guidelines reviewed: underweight/BMI 18.5 or less > 28 - 40 lbs; normal weight/BMI 18.5 - 24.9 > 25 - 35 lbs; overweight/BMI 25 - 29.9 > 15 - 25 lbs; obese/BMI  30 or more > 11 - 20 lbs.  Discussed clinic routines, schedule of care and testing, genetic screening options, involvement of students and residents under the direct supervision of APPs and doctors and presence of female providers. Pt verbalized understanding.  Future Appointments  Date Time Provider Department Center  03/09/2024  2:15 PM Delores Nidia CROME, FNP [facility] [id number]  04/06/2024  2:55 PM Delores Nidia CROME, FNP [facility] [id number]    Delores Nidia CROME, FNP       [1]  Current Outpatient Medications on File Prior to Visit  Medication Sig Dispense Refill   acyclovir  (ZOVIRAX ) 400 MG tablet Take 1 tablet (400 mg total) by mouth 2 (two) times daily. (Patient not taking: Reported on 01/06/2024) 60 tablet 3   albuterol (VENTOLIN HFA) 108 (90 Base) MCG/ACT inhaler Inhale 2 puffs into the lungs 3 (three) times daily as needed for wheezing or shortness of breath.     fluticasone furoate-vilanterol (BREO ELLIPTA) 100-25 MCG/ACT AEPB Inhale 1 puff into the lungs daily.     metoprolol tartrate (LOPRESSOR) 25 MG tablet Take 25 mg by mouth as needed (palpitations). (Patient not taking: Reported on  10/29/2023)     montelukast (SINGULAIR) 10 MG tablet Take 10 mg by mouth at bedtime.  (Patient not taking: Reported on 01/06/2024)  1   Prenatal 27-1 MG TABS Take 1 tablet by mouth daily. 30 tablet 11   Prenatal Vit-Fe Fumarate-FA (PRENATAL VITAMIN) 27-0.8 MG TABS Take 1 tablet by mouth daily.     promethazine  (PHENERGAN ) 25 MG tablet Take 1 tablet (25 mg total) by mouth every 6 (six) hours as needed. 30 tablet 0   terconazole  (TERAZOL 7 ) 0.4 % vaginal cream Place 1 applicator vaginally at bedtime. 45 g 0   Vitamin D , Ergocalciferol , (DRISDOL) 1.25 MG (50000 UNIT) CAPS capsule Take 50,000 Units by mouth once a week. (Patient not taking: Reported on 01/06/2024)     No current facility-administered medications on file prior to visit.  [2] No Known  Allergies  "

## 2024-02-11 ENCOUNTER — Ambulatory Visit: Payer: Self-pay | Admitting: Obstetrics and Gynecology

## 2024-02-11 DIAGNOSIS — Z3480 Encounter for supervision of other normal pregnancy, unspecified trimester: Secondary | ICD-10-CM

## 2024-02-11 LAB — CBC
Hematocrit: 41.8 % (ref 34.0–46.6)
Hemoglobin: 13.8 g/dL (ref 11.1–15.9)
MCH: 31.8 pg (ref 26.6–33.0)
MCHC: 33 g/dL (ref 31.5–35.7)
MCV: 96 fL (ref 79–97)
Platelets: 253 10*3/uL (ref 150–450)
RBC: 4.34 x10E6/uL (ref 3.77–5.28)
RDW: 12.6 % (ref 11.7–15.4)
WBC: 12.5 10*3/uL — ABNORMAL HIGH (ref 3.4–10.8)

## 2024-02-11 LAB — HEMOGLOBIN A1C
Est. average glucose Bld gHb Est-mCnc: 114 mg/dL
Hgb A1c MFr Bld: 5.6 % (ref 4.8–5.6)

## 2024-02-11 LAB — HEPATITIS B SURFACE ANTIGEN: Hepatitis B Surface Ag: NEGATIVE

## 2024-02-11 LAB — CERVICOVAGINAL ANCILLARY ONLY
Chlamydia: NEGATIVE
Comment: NEGATIVE
Comment: NEGATIVE
Comment: NORMAL
Neisseria Gonorrhea: NEGATIVE
Trichomonas: NEGATIVE

## 2024-02-11 LAB — SYPHILIS: RPR W/REFLEX TO RPR TITER AND TREPONEMAL ANTIBODIES, TRADITIONAL SCREENING AND DIAGNOSIS ALGORITHM: RPR Ser Ql: NONREACTIVE

## 2024-02-11 LAB — ANTIBODY SCREEN: Antibody Screen: NEGATIVE

## 2024-02-11 LAB — ABO/RH: Rh Factor: POSITIVE

## 2024-02-11 LAB — HIV ANTIBODY (ROUTINE TESTING W REFLEX): HIV Screen 4th Generation wRfx: NONREACTIVE

## 2024-02-11 LAB — HEPATITIS C ANTIBODY: Hep C Virus Ab: NONREACTIVE

## 2024-02-11 LAB — RUBELLA SCREEN: Rubella Antibodies, IGG: 3.87 {index}

## 2024-02-12 LAB — CULTURE, OB URINE

## 2024-02-12 LAB — URINE CULTURE, OB REFLEX

## 2024-02-16 LAB — PANORAMA PRENATAL TEST FULL PANEL:PANORAMA TEST PLUS 5 ADDITIONAL MICRODELETIONS: FETAL FRACTION: 7.5

## 2024-02-19 LAB — HORIZON CUSTOM: REPORT SUMMARY: NEGATIVE

## 2024-03-09 ENCOUNTER — Encounter (HOSPITAL_BASED_OUTPATIENT_CLINIC_OR_DEPARTMENT_OTHER): Payer: Self-pay | Admitting: Obstetrics and Gynecology

## 2024-03-18 ENCOUNTER — Encounter (HOSPITAL_BASED_OUTPATIENT_CLINIC_OR_DEPARTMENT_OTHER): Payer: Self-pay | Admitting: Obstetrics and Gynecology

## 2024-04-06 ENCOUNTER — Encounter (HOSPITAL_BASED_OUTPATIENT_CLINIC_OR_DEPARTMENT_OTHER): Payer: Self-pay | Admitting: Obstetrics & Gynecology
